# Patient Record
Sex: Female | Born: 1975 | Hispanic: No | Smoking: Never smoker
Health system: Southern US, Community
[De-identification: ages and names within clinical notes are randomized; demographics above are authoritative.]

## PROBLEM LIST (undated history)

## (undated) DIAGNOSIS — Z789 Other specified health status: Secondary | ICD-10-CM

## (undated) DIAGNOSIS — N289 Disorder of kidney and ureter, unspecified: Secondary | ICD-10-CM

## (undated) DIAGNOSIS — N210 Calculus in bladder: Secondary | ICD-10-CM

## (undated) DIAGNOSIS — R7303 Prediabetes: Secondary | ICD-10-CM

## (undated) DIAGNOSIS — E785 Hyperlipidemia, unspecified: Secondary | ICD-10-CM

## (undated) DIAGNOSIS — K219 Gastro-esophageal reflux disease without esophagitis: Secondary | ICD-10-CM

## (undated) HISTORY — PX: BREAST SURGERY: SHX581

## (undated) HISTORY — DX: Disorder of kidney and ureter, unspecified: N28.9

## (undated) HISTORY — PX: AUGMENTATION MAMMAPLASTY: SUR837

## (undated) HISTORY — PX: ABDOMINOPLASTY: SUR9

## (undated) HISTORY — DX: Calculus in bladder: N21.0

---

## 2004-08-30 HISTORY — PX: COSMETIC SURGERY: SHX468

## 2006-01-06 ENCOUNTER — Emergency Department: Payer: Self-pay | Admitting: Emergency Medicine

## 2010-10-12 ENCOUNTER — Emergency Department: Payer: Self-pay | Admitting: Emergency Medicine

## 2010-11-25 ENCOUNTER — Ambulatory Visit: Payer: Self-pay | Admitting: Obstetrics and Gynecology

## 2010-11-27 ENCOUNTER — Ambulatory Visit: Payer: Self-pay | Admitting: Obstetrics and Gynecology

## 2016-12-09 ENCOUNTER — Other Ambulatory Visit: Payer: Self-pay | Admitting: Physician Assistant

## 2016-12-09 DIAGNOSIS — Z3689 Encounter for other specified antenatal screening: Secondary | ICD-10-CM

## 2016-12-09 DIAGNOSIS — O30009 Twin pregnancy, unspecified number of placenta and unspecified number of amniotic sacs, unspecified trimester: Principal | ICD-10-CM

## 2016-12-27 ENCOUNTER — Ambulatory Visit
Admission: RE | Admit: 2016-12-27 | Discharge: 2016-12-27 | Disposition: A | Discharge status: Home / Self Care | Payer: Medicaid Other | Source: Ambulatory Visit | Attending: Maternal & Fetal Medicine | Admitting: Maternal & Fetal Medicine

## 2016-12-27 VITALS — BP 125/67 | HR 88 | Temp 98.4°F | Resp 18 | Ht 62.0 in | Wt 155.0 lb

## 2016-12-27 DIAGNOSIS — Z3A18 18 weeks gestation of pregnancy: Secondary | ICD-10-CM | POA: Diagnosis not present

## 2016-12-27 DIAGNOSIS — Z3689 Encounter for other specified antenatal screening: Secondary | ICD-10-CM | POA: Diagnosis present

## 2016-12-27 DIAGNOSIS — O30009 Twin pregnancy, unspecified number of placenta and unspecified number of amniotic sacs, unspecified trimester: Secondary | ICD-10-CM

## 2016-12-27 DIAGNOSIS — O09522 Supervision of elderly multigravida, second trimester: Secondary | ICD-10-CM | POA: Diagnosis not present

## 2016-12-27 HISTORY — DX: Other specified health status: Z78.9

## 2016-12-27 NOTE — Progress Notes (Addendum)
Referring Provider:   [facility] Department Length of Consultation: 45 minutes  Megan Knight was referred to [facility] [place] for genetic counseling because of advanced maternal age.  The patient will be 41 years old at the time of delivery.  This note summarizes the information we discussed.    We explained that the chance of a chromosome abnormality increases with maternal age.  Chromosomes and examples of chromosome problems were reviewed.  Humans typically have 46 chromosomes in each cell, with half passed through each sperm and egg.  Any change in the number or structure of chromosomes can increase the risk of problems in the physical and mental development of a pregnancy.   Based upon age of the patient, the chance of any chromosome abnormality was 1 in 79. The chance of Down syndrome, the most common chromosome problem associated with maternal age, was 1 in 22.  The risk of chromosome problems is in addition to the 3% general population risk for birth defects and mental retardation.  The greatest chance, of course, is that the baby would be born in good health.  We discussed the following prenatal screening and testing options for this pregnancy:  Maternal serum marker screening, a blood test that measures pregnancy proteins, can provide risk assessments for Down syndrome, trisomy 18, and open neural tube defects (spina bifida, anencephaly). Because it does not directly examine the fetus, it cannot positively diagnose or rule out these problems.  Targeted ultrasound uses high frequency sound waves to create an image of the developing fetus.  An ultrasound is often recommended as a routine means of evaluating the pregnancy.  It is also used to screen for fetal anatomy problems (for example, a heart defect) that might be suggestive of a chromosomal or other abnormality.   Amniocentesis involves the removal of a small amount of amniotic fluid from the sac  surrounding the fetus with the use of a thin needle inserted through the maternal abdomen and uterus.  Ultrasound guidance is used throughout the procedure.  Fetal cells from amniotic fluid are directly evaluated and > 99.5% of chromosome problems and > 98% of open neural tube defects can be detected. This procedure is generally performed after the 15th week of pregnancy.  The main risks to this procedure include complications leading to miscarriage in less than 1 in 200 cases (0.5%).  We also reviewed the availability of cell free fetal DNA testing from maternal blood to determine whether or not the baby may have either Down syndrome, trisomy 60, or trisomy 41.  This test utilizes a maternal blood sample and DNA sequencing technology to isolate circulating cell free fetal DNA from maternal plasma.  The fetal DNA can then be analyzed for DNA sequences that are derived from the three most common chromosomes involved in aneuploidy, chromosomes 13, 18, and 21.  If the overall amount of DNA is greater than the expected level for any of these chromosomes, aneuploidy is suspected.  While we do not consider it a replacement for invasive testing and karyotype analysis, a negative result from this testing would be reassuring, though not a guarantee of a normal chromosome complement for the baby.  An abnormal result is certainly suggestive of an abnormal chromosome complement, though we would still recommend amniocentesis to confirm any findings from this testing.  Cystic Fibrosis and Spinal Muscular Atrophy (SMA) screening were also discussed with the patient. Both conditions are recessive, which means that both parents must be carriers in order  to have a child with the disease.  Cystic fibrosis (CF) is one of the most common genetic conditions in persons of Caucasian ancestry.  This condition occurs in approximately 1 in 2,500 Caucasian persons and results in thickened secretions in the lungs, digestive, and  reproductive systems.  For a baby to be at risk for having CF, both of the parents must be carriers for this condition.  Approximately 1 in 41 Caucasian persons is a carrier for CF.  Current carrier testing looks for the most common mutations in the gene for CF and can detect approximately 90% of carriers in the Caucasian population.  This means that the carrier screening can greatly reduce, but cannot eliminate, the chance for an individual to have a child with CF.  If an individual is found to be a carrier for CF, then carrier testing would be available for the partner. As part of Megan Knight newborn screening profile, all babies born in the state of [place] will have a two-tier screening process.  Specimens are first tested to determine the concentration of immunoreactive trypsinogen (IRT).  The top 5% of specimens with the highest IRT values then undergo DNA testing using a panel of over 40 common CF mutations. SMA is a neurodegenerative disorder that leads to atrophy of skeletal muscle and overall weakness.  This condition is also more prevalent in the Caucasian population, with 1 in 40-1 in 60 persons being a carrier and 1 in 6,000-1 in 10,000 children being affected.  There are multiple forms of the disease, with some causing death in infancy to other forms with survival into adulthood.  The genetics of SMA is complex, but carrier screening can detect up to 95% of carriers in the Caucasian population.  Similar to CF, a negative result can greatly reduce, but cannot eliminate, the chance to have a child with SMA.  We obtained a detailed family history and pregnancy history.  The remainder of the family history is unremarkable for birth defects, developmental delays, recurrent pregnancy loss or known chromosome abnormalities.  Megan Knight stated that this is her fourth pregnancy, the second with her current partner.  She has two health children, ages 32 and 82, from a prior relationship.  She  reported no complications or exposures in this pregnancy that would be expected to increase the risk for birth defects.  After consideration of the options, Megan Knight elected to proceed with an ultrasound, cell free fetal DNA testing and msAFP screening.  She declined carrier testing for CF and SMA and declined amniocentesis at this time.  An ultrasound was performed at the time of the visit.  The gestational age was consistent with 18 weeks.   Parts of the fetal anatomy were suboptimally seen including the heart, spine and nasal bone.  The right renal pelvis measured 4.61mm, above the cutoff of 4.76mm. This finding was reviewed with the patient along with the recommendation for follow up imaging at 28 weeks.  Please refer to the ultrasound report for details of the study.  Given the suboptimal results of today's ultrasound and the maternal age greater than 40 years, we will schedule for her to be seen at the [facility] in approximately 2 weeks for completion of anatomy and a screening fetal echocardiogram.  Megan Knight was encouraged to call with questions or concerns.  We can be contacted at [phone].   Cherly Anderson, MS, CGC  I was immediately available and supervising. Argentina Ponder, MD [facility]

## 2016-12-30 LAB — AFP, SERUM, OPEN SPINA BIFIDA
AFP MoM: 1.68
AFP Value: 70.5 ng/mL
Gest. Age on Collection Date: 18 weeks
Maternal Age At EDD: 41.2 yr
OSBR Risk 1 IN: 1713
Test Results:: NEGATIVE
WEIGHT: 155 [lb_av]

## 2016-12-31 LAB — INFORMASEQ(SM) WITH XY ANALYSIS
FETAL NUMBER: 1
Fetal Fraction (%):: 11.4
Gestational Age at Collection: 18 weeks
Weight: 155 [lb_av]

## 2017-01-03 ENCOUNTER — Telehealth: Payer: Self-pay | Admitting: Obstetrics and Gynecology

## 2017-01-03 ENCOUNTER — Other Ambulatory Visit: Payer: Self-pay | Admitting: Obstetrics and Gynecology

## 2017-01-03 NOTE — Telephone Encounter (Signed)
The patient was informed of the results of her recent InformaSeq testing (performed at [facility]) which yielded NEGATIVE results.  The patient's specimen showed DNA consistent with two copies of chromosomes 21, 18 and 13.  The sensitivity for trisomy 21, trisomy 18 and trisomy 13 using this testing are reported as 99.1%, 98.3% and 98.1% respectively.  Thus, while the results of this testing are highly accurate, they are not considered diagnostic at this time.  Should more definitive information be desired, the patient may still consider amniocentesis.   As requested to know by the patient, sex chromosome analysis was included for this sample.  Results was consistent with a female (XY)  fetus. This is predicted with >97% accuracy.  A maternal serum AFP was also drawn at the time of her visit.  Results are within normal limits. The chance for a open neural tube defect is estimated to be 1 in 1,713.  The patient was informed of these results and knows that she needs to be scheduled for a follow up ultrasound at [facility] in the next couple of weeks.  Ronisha Herringshaw F. Frantz Quattrone, MS, CGC  

## 2017-01-03 NOTE — Telephone Encounter (Signed)
The patient was informed of the results of her recent InformaSeq testing (performed at [facility]) which yielded NEGATIVE results.  The patient's specimen showed DNA consistent with two copies of chromosomes 21, 18 and 13.  The sensitivity for trisomy 5721, trisomy 6318 and trisomy 5413 using this testing are reported as 99.1%, 98.3% and 98.1% respectively.  Thus, while the results of this testing are highly accurate, they are not considered diagnostic at this time.  Should more definitive information be desired, the patient may still consider amniocentesis.   As requested to know by the patient, sex chromosome analysis was included for this sample.  Results was consistent with a female (XY)  fetus. This is predicted with >97% accuracy.  A maternal serum AFP was also drawn at the time of her visit.  Results are within normal limits. The chance for a open neural tube defect is estimated to be 1 in 1,713.  The patient was informed of these results and knows that she needs to be [facility] in the next couple of weeks.  Cherly Andersoneborah F. Axelle Szwed, MS, CGC

## 2017-03-30 ENCOUNTER — Other Ambulatory Visit: Payer: Self-pay | Admitting: Nurse Practitioner

## 2017-03-30 DIAGNOSIS — Z3689 Encounter for other specified antenatal screening: Secondary | ICD-10-CM

## 2017-04-07 ENCOUNTER — Ambulatory Visit
Admission: RE | Admit: 2017-04-07 | Discharge: 2017-04-07 | Disposition: A | Discharge status: Home / Self Care | Payer: Medicaid Other | Source: Ambulatory Visit | Attending: Obstetrics & Gynecology | Admitting: Obstetrics & Gynecology

## 2017-04-07 DIAGNOSIS — Z3A32 32 weeks gestation of pregnancy: Secondary | ICD-10-CM | POA: Insufficient documentation

## 2017-04-07 DIAGNOSIS — Z3689 Encounter for other specified antenatal screening: Secondary | ICD-10-CM | POA: Insufficient documentation

## 2017-07-30 ENCOUNTER — Emergency Department: Payer: Medicaid Other

## 2017-07-30 ENCOUNTER — Other Ambulatory Visit: Payer: Self-pay

## 2017-07-30 ENCOUNTER — Emergency Department
Admission: EM | Admit: 2017-07-30 | Discharge: 2017-07-30 | Disposition: A | Discharge status: Home / Self Care | Payer: Medicaid Other | Attending: Emergency Medicine | Admitting: Emergency Medicine

## 2017-07-30 ENCOUNTER — Encounter: Payer: Self-pay | Admitting: Emergency Medicine

## 2017-07-30 DIAGNOSIS — R1011 Right upper quadrant pain: Secondary | ICD-10-CM | POA: Insufficient documentation

## 2017-07-30 DIAGNOSIS — K8050 Calculus of bile duct without cholangitis or cholecystitis without obstruction: Secondary | ICD-10-CM

## 2017-07-30 LAB — TROPONIN I: Troponin I: <0.03 ng/mL (ref <0.03)

## 2017-07-30 LAB — CBC
HCT: 44.1 % (ref 35.0–47.0)
Hemoglobin: 14.8 g/dL (ref 12.0–16.0)
MCH: 30.2 pg (ref 26.0–34.0)
MCHC: 33.5 g/dL (ref 32.0–36.0)
MCV: 90.1 fL (ref 80.0–100.0)
PLATELETS: 292 10*3/uL (ref 150–440)
RBC: 4.9 MIL/uL (ref 3.80–5.20)
RDW: 13.8 % (ref 11.5–14.5)
WBC: 11.9 10*3/uL — ABNORMAL HIGH (ref 3.6–11.0)

## 2017-07-30 LAB — URINALYSIS, COMPLETE (UACMP) WITH MICROSCOPIC
Bacteria, UA: NONE SEEN
Bilirubin Urine: NEGATIVE
GLUCOSE, UA: NEGATIVE mg/dL
Ketones, ur: NEGATIVE mg/dL
Leukocytes, UA: NEGATIVE
Nitrite: NEGATIVE
PROTEIN: NEGATIVE mg/dL
SPECIFIC GRAVITY, URINE: 1.014 (ref 1.005–1.030)
pH: 6 (ref 5.0–8.0)

## 2017-07-30 LAB — BASIC METABOLIC PANEL
Anion gap: 6 (ref 5–15)
BUN: 16 mg/dL (ref 6–20)
CO2: 27 mmol/L (ref 22–32)
Calcium: 9.7 mg/dL (ref 8.9–10.3)
Chloride: 105 mmol/L (ref 101–111)
Creatinine, Ser: 0.8 mg/dL (ref 0.44–1.00)
GFR calc non Af Amer: >60 mL/min (ref >60)
Glucose, Bld: 122 mg/dL — ABNORMAL HIGH (ref 65–99)
Potassium: 3.9 mmol/L (ref 3.5–5.1)
SODIUM: 138 mmol/L (ref 135–145)

## 2017-07-30 LAB — POCT PREGNANCY, URINE: PREG TEST UR: NEGATIVE

## 2017-07-30 LAB — LIPASE, BLOOD: LIPASE: 40 U/L (ref 11–51)

## 2017-07-30 MED ORDER — ONDANSETRON HCL 4 MG PO TABS: 4.0000 mg | ORAL_TABLET | Freq: Every day | ORAL | 0 refills | Status: DC | PRN

## 2017-07-30 MED ORDER — GI COCKTAIL ~~LOC~~
30.0000 mL | Freq: Once | ORAL | Status: AC
  Administered 2017-07-30: 30 mL via ORAL

## 2017-07-30 MED ORDER — GI COCKTAIL ~~LOC~~
ORAL | Status: AC
  Filled 2017-07-30: qty 30

## 2017-07-30 MED ORDER — IBUPROFEN 600 MG PO TABS: 600.0000 mg | ORAL_TABLET | Freq: Three times a day (TID) | ORAL | 0 refills | Status: DC | PRN

## 2017-07-30 MED ORDER — HYDRALAZINE HCL 20 MG/ML IJ SOLN: 5.0000 mg | Freq: Once | INTRAMUSCULAR | Status: DC

## 2017-07-30 NOTE — ED Triage Notes (Signed)
Pt reports that she has been having upper abdominal pain since last night. Pt reports hx of GERD. Pt is in NAD.

## 2017-07-30 NOTE — Discharge Instructions (Signed)
Please take your pain medication as needed for severe symptoms and follow-up with the general surgeon in a week or so for reevaluation.  Return to the emergency department sooner for any concerns whatsoever.  It was a pleasure to take care of you today, and thank you for coming to our emergency department.  If you have any questions or concerns before leaving please ask the nurse to grab me and I'm more than happy to go through your aftercare instructions again.  If you were prescribed any opioid pain medication today such as Norco, Vicodin, Percocet, morphine, hydrocodone, or oxycodone please make sure you do not drive when you are taking this medication as it can alter your ability to drive safely.  If you have any concerns once you are home that you are not improving or are in fact getting worse before you can make it to your follow-up appointment, please do not hesitate to call 911 and come back for further evaluation.  Merrily BrittleNeil Santiel Topper, MD  Results for orders placed or performed during the hospital encounter of 07/30/17  Basic metabolic panel  Result Value Ref Range   Sodium 138 135 - 145 mmol/L   Potassium 3.9 3.5 - 5.1 mmol/L   Chloride 105 101 - 111 mmol/L   CO2 27 22 - 32 mmol/L   Glucose, Bld 122 (H) 65 - 99 mg/dL   BUN 16 6 - 20 mg/dL   Creatinine, Ser 0.980.80 0.44 - 1.00 mg/dL   Calcium 9.7 8.9 - 11.910.3 mg/dL   GFR calc non Af Amer >60 >60 mL/min   GFR calc Af Amer >60 >60 mL/min   Anion gap 6 5 - 15  CBC  Result Value Ref Range   WBC 11.9 (H) 3.6 - 11.0 K/uL   RBC 4.90 3.80 - 5.20 MIL/uL   Hemoglobin 14.8 12.0 - 16.0 g/dL   HCT 14.744.1 82.935.0 - 56.247.0 %   MCV 90.1 80.0 - 100.0 fL   MCH 30.2 26.0 - 34.0 pg   MCHC 33.5 32.0 - 36.0 g/dL   RDW 13.013.8 86.511.5 - 78.414.5 %   Platelets 292 150 - 440 K/uL  Troponin I  Result Value Ref Range   Troponin I <0.03 <0.03 ng/mL  Lipase, blood  Result Value Ref Range   Lipase 40 11 - 51 U/L  Urinalysis, Complete w Microscopic  Result Value Ref Range     Color, Urine YELLOW (A) YELLOW   APPearance CLEAR (A) CLEAR   Specific Gravity, Urine 1.014 1.005 - 1.030   pH 6.0 5.0 - 8.0   Glucose, UA NEGATIVE NEGATIVE mg/dL   Hgb urine dipstick SMALL (A) NEGATIVE   Bilirubin Urine NEGATIVE NEGATIVE   Ketones, ur NEGATIVE NEGATIVE mg/dL   Protein, ur NEGATIVE NEGATIVE mg/dL   Nitrite NEGATIVE NEGATIVE   Leukocytes, UA NEGATIVE NEGATIVE   RBC / HPF 0-5 0 - 5 RBC/hpf   WBC, UA 0-5 0 - 5 WBC/hpf   Bacteria, UA NONE SEEN NONE SEEN   Squamous Epithelial / LPF 0-5 (A) NONE SEEN  Pregnancy, urine POC  Result Value Ref Range   Preg Test, Ur NEGATIVE NEGATIVE   Dg Chest 2 View  Result Date: 07/30/2017 CLINICAL DATA:  Upper abdominal pain.  Chest pain. EXAM: CHEST  2 VIEW COMPARISON:  None. FINDINGS: The cardiomediastinal contours are normal. The lungs are clear. Pulmonary vasculature is normal. No consolidation, pleural effusion, or pneumothorax. No acute osseous abnormalities are seen. IMPRESSION: No acute pulmonary process. Electronically Signed  By: Rubye OaksMelanie  Ehinger M.D.   On: 07/30/2017 05:17   [place] Abdomen Limited Ruq  Result Date: 07/30/2017 CLINICAL DATA:  Right upper quadrant pain EXAM: ULTRASOUND ABDOMEN LIMITED RIGHT UPPER QUADRANT COMPARISON:  None. FINDINGS: Gallbladder: Few small mobile shadowing stones, the largest 1.5 cm. No wall thickening or sonographic Murphy sign. Common bile duct: Diameter: Normal caliber, 3 mm. Liver: Slight increased echotexture throughout the liver suggesting fatty infiltration. No focal abnormality. Portal vein is patent on color Doppler imaging with normal direction of blood flow towards the liver. IMPRESSION: Cholelithiasis.  No sonographic evidence of acute cholecystitis. Fatty infiltration of the liver. Electronically Signed   By: Charlett NoseKevin  Dover M.D.   On: 07/30/2017 07:15

## 2017-07-30 NOTE — ED Provider Notes (Signed)
[facility] Emergency Department Provider Note    First MD Initiated Contact with Patient 07/30/17 [phone]     (approximate)  I have reviewed the triage vital signs and the nursing notes.   HISTORY  Chief Complaint Chest Pain    HPI Megan Knight is a 41 y.o. female 2 months postpartum presents to the emergency department with right upper quadrant/epigastric abdominal pain which began last night.  Patient states current pain score is 8 out of 10.  Patient denies any fever.  Patient is admit to nausea and vomiting.   Past Medical History:  Diagnosis Date  . Medical history non-contributory     Patient Active Problem List   Diagnosis Date Noted  . Advanced maternal age in multigravida, second trimester     Past Surgical History:  Procedure Laterality Date  . CESAREAN SECTION      Prior to Admission medications   Not on File    Allergies No known drug allergies  No family history on file.  Social History Social History   Tobacco Use  . Smoking status: Never Smoker  . Smokeless tobacco: Never Used  Substance Use Topics  . Alcohol use: No  . Drug use: No    Review of Systems Constitutional: No fever/chills Eyes: No visual changes. ENT: No sore throat. Cardiovascular: Denies chest pain. Respiratory: Denies shortness of breath. Gastrointestinal: Positive for abdominal pain, nausea, vomiting.  No diarrhea.  No constipation. Genitourinary: Negative for dysuria. Musculoskeletal: Negative for neck pain.  Negative for back pain. Integumentary: Negative for rash. Neurological: Negative for headaches, focal weakness or numbness.   ____________________________________________   PHYSICAL EXAM:  VITAL SIGNS: ED Triage Vitals  Enc Vitals Group     BP 07/30/17 0439 130/70     Pulse Rate 07/30/17 0439 78     Resp 07/30/17 0439 18     Temp 07/30/17 0439 97.9 F (36.6 C)     Temp Source 07/30/17 0439 Oral     SpO2 07/30/17 0439 100 %      Weight 07/30/17 0436 67.1 kg (148 lb)     Height 07/30/17 0436 1.6 m (5\' 3" )     Head Circumference --      Peak Flow --      Pain Score 07/30/17 0432 10     Pain Loc --      Pain Edu? --      Excl. in GC? --     Constitutional: Alert and oriented. Well appearing and in no acute distress. Eyes: Conjunctivae are normal.  Head: Atraumatic. Mouth/Throat: Mucous membranes are moist.  Oropharynx non-erythematous. Neck: No stridor.  Cardiovascular: Normal rate, regular rhythm. Good peripheral circulation. Grossly normal heart sounds. Respiratory: Normal respiratory effort.  No retractions. Lungs CTAB. Gastrointestinal: Right upper quadrant tenderness to palpation. No distention.  Musculoskeletal: No lower extremity tenderness nor edema. No gross deformities of extremities. Neurologic:  Normal speech and language. No gross focal neurologic deficits are appreciated.  Skin:  Skin is warm, dry and intact. No rash noted. Psychiatric: Mood and affect are normal. Speech and behavior are normal.  ____________________________________________   LABS (all labs ordered are listed, but only abnormal results are displayed)  Labs Reviewed  BASIC METABOLIC PANEL - Abnormal; Notable for the following components:      Result Value   Glucose, Bld 122 (*)    All other components within normal limits  CBC - Abnormal; Notable for the following components:   WBC 11.9 (*)  All other components within normal limits  URINALYSIS, COMPLETE (UACMP) WITH MICROSCOPIC - Abnormal; Notable for the following components:   Color, Urine YELLOW (*)    APPearance CLEAR (*)    Hgb urine dipstick SMALL (*)    Squamous Epithelial / LPF 0-5 (*)    All other components within normal limits  TROPONIN I  LIPASE, BLOOD  POCT PREGNANCY, URINE  POC URINE PREG, ED   ____________________________________________  EKG  ED ECG REPORT I, Kingston N BROWN, the attending physician, personally viewed and interpreted this  ECG.   Date: 07/30/2017  EKG Time: 4:35 AM  Rate: 87  Rhythm: Normal sinus rhythm  Axis: Normal  Intervals: Normal  ST&T Change: None  ____________________________________________  RADIOLOGY I, Cheney N BROWN, personally viewed and evaluated these images (plain radiographs) as part of my medical decision making, as well as reviewing the written report by the radiologist.  Dg Chest 2 View  Result Date: 07/30/2017 CLINICAL DATA:  Upper abdominal pain.  Chest pain. EXAM: CHEST  2 VIEW COMPARISON:  None. FINDINGS: The cardiomediastinal contours are normal. The lungs are clear. Pulmonary vasculature is normal. No consolidation, pleural effusion, or pneumothorax. No acute osseous abnormalities are seen. IMPRESSION: No acute pulmonary process. Electronically Signed   By: Rubye OaksMelanie  Ehinger M.D.   On: 07/30/2017 05:17     Procedures   ____________________________________________   INITIAL IMPRESSION / ASSESSMENT AND PLAN / ED COURSE  As part of my medical decision making, I reviewed the following data within the electronic MEDICAL RECORD [id number] year old female present with history and physical exam concerning for possible cholelithiasis.    FINAL CLINICAL IMPRESSION(S) / ED DIAGNOSES  Final diagnoses:  RUQ pain     MEDICATIONS GIVEN DURING THIS VISIT:  Medications  gi cocktail suspension (not administered)  gi cocktail (Maalox,Lidocaine,Donnatal) (30 mLs Oral Given 07/30/17 [id number])     ED Discharge Orders    None       Note:  This document was prepared using Dragon voice recognition software and may include unintentional dictation errors.    Darci CurrentBrown, Irene N, MD 07/30/17 [phone]

## 2017-07-30 NOTE — ED Provider Notes (Signed)
Care signed over from Dr. Manson PasseyBrown pending right upper quadrant ultrasound.  Right upper quadrant ultrasound shows 1.5 cm stone with no evidence of cholecystitis.  The patient is able to eat and drink.  I will treat her symptomatically with nonsteroidals and Zofran and refer her to general surgery.  Patient verbalized understanding and agreement the plan.   Merrily Brittleifenbark, Anshika Pethtel, MD 07/30/17 [phone]

## 2017-07-30 NOTE — ED Notes (Signed)
ED Provider at bedside. 

## 2017-07-30 NOTE — ED Notes (Signed)
Pt in US

## 2017-10-13 ENCOUNTER — Ambulatory Visit (INDEPENDENT_AMBULATORY_CARE_PROVIDER_SITE_OTHER): Payer: Medicaid Other | Admitting: Surgery

## 2017-10-13 ENCOUNTER — Encounter: Payer: Self-pay | Admitting: Surgery

## 2017-10-13 VITALS — BP 136/89 | HR 98 | Temp 98.1°F | Ht 63.0 in | Wt 149.4 lb

## 2017-10-13 DIAGNOSIS — R1011 Right upper quadrant pain: Secondary | ICD-10-CM

## 2017-10-13 MED ORDER — OMEPRAZOLE 20 MG PO CPDR: 20.0000 mg | DELAYED_RELEASE_CAPSULE | Freq: Every day | ORAL | 0 refills | Status: DC

## 2017-10-13 NOTE — Patient Instructions (Addendum)
Hy b?t ??u dng Prilosec m?t vin m?i ngy. Hy ghi l?i nh?ng lo?i th?c ph?m b?n ? ?n khi?n b?n b? ?au b?ng. Chng ti s? g?p l?i b?n sau m?t thng n?a ?? ch?c ch?n r?ng b?n ? c?m th?y nh? th? no.   B?nh s?i m?t Cholelithiasis B?nh s?i m?t l m?t d?ng b?nh ? ti m?t trong ? c s?i hnh thnh trong ti m?t. Ti m?t l m?t c? quan l?u tr? m?t. M?t ???c s?n sinh trong gan v n gip tiu ha cc ch?t bo. S?i m?t b?t ??u d??i d?ng tinh th? nh? v d?n d?n pht tri?n thnh s?i. S?i khng gy ra tri?u ch?ng g cho ??n khi ti m?t th?t ch?t l?i (co l?i) v s?i m?t lm t?c ?ng d?n (c?n ?au qu?n m?t), tnh tr?ng ny c th? gy ?au. B?nh s?i m?t cn ???c g?i l s?i m?t. C hai lo?i s?i m?t chnh:  S?i sholesterol. S?i c c?u t?o t? cholesterol c?ng l?i v th??ng c mu vng-xanh. ?y l lo?i s?i m?t ph? bi?n nh?t. Cholesterol l m?t ch?t gi?ng m? c mu tr?ng, d?ng sp ???c s?n sinh trong gan.  S?i s?c t? m?t. ?y l lo?i s?i s?m mu v ???c t?o thnh t? m?t ch?t c mu ??-vng hnh thnh khi hemoglobin ? h?ng c?u v? ra (bilirubin).  Nguyn nhn g gy ra? Nguyn nhn c?a tnh tr?ng ny c th? l do s? m?t cn b?ng trong cc ch?t c?u t?o c?a m?t. Tnh tr?ng ny c th? x?y ra n?u m?t:  C qu nhi?u bilirubin.  C qu nhi?u cholesterol.  Khng c ?? mu?i m?t. Cc lo?i mu?i ny gip cho c? th? h?p thu v tiu ha cc ch?t bo.  Trong m?t s? tr??ng h?p, nguyn nhn c?a tnh tr?ng ny c?ng c th? l do ti m?t khng x? h?t d?ch m?t ho?c x? d?ch m?t khng th??ng xuyn. ?i?u g lm t?ng nguy c?? Nh?ng y?u t? sau c th? lm qu v? d? b? tnh tr?ng ny h?n:  L ph? n?.  Mang ?a [place]. Chuyn gia ch?m [place] s?c kh?e ?i khi t? v?n lo?i b? s?i th?n tr??c khi mang thai nh?ng l?n ti?p theo.  ?n ch? ?? ?n nhi?u th?c ?n chin/rn, giu ch?t bo v cc lo?i hy?at-cacbon tinh luy?n, nh? bnh m tr?ng v g?o tr?ng.  B? bo ph.  C ?? tu?i trn 40.  S? d?ng lu di c?a cc lo?i thu?c c ch?a hc mn n?  (estrogen).  B? b?nh ti?u ???ng.  Gi?m cn nhanh.  C ti?n s? gia ?nh b? s?i m?t.  L ng??i da ?? M? ho?c ng??i c ngu?n g?c [place].  B? b?nh ???ng ru?t, ch?ng h?n nh? b?nh [place].  B? h?i ch?ng chuy?n ha.  B? b?nh x? gan.  B? cc lo?i thi?u mu n?ng nh? thi?u mu h?ng c?u hnh li?m.  Cc d?u hi?u ho?c tri?u ch?ng l g? Trong h?u h?t cc tr??ng h?p, khng c tri?u ch?ng g. Nh?ng tr??ng h?p ny ???c g?i l s?i m?t th?m l?ng. N?u s?i m?t lm t?c ?ng d?n m?t, n c th? gy ra hi?n t??ng c?n ?au qu?n m?t. Tri?u ch?ng chnh c?a c?n ?au qu?n m?t l ?au ??t ng?t ? b?ng trn bn ph?i. C?n ?au th??ng ??n vo ban ?m ho?c sau khi ?n no. C?n ?au c th? ko di m?t ho?c vi gi? v c th? lan sang ng?c ho?c vai ph?i. N?u ?ng m?t b? t?c trn  vi gi?, n c th? gy nhi?m trng ho?c vim ti m?t, vim gan, ho?c vim t?y, t? ? c th? d?n ??n:  Bu?n nn.  Nn.  ?au b?ng ko di t? 5 gi? tr? ln.  S?t ho?c ?n l?nh.  Gy vng da ho?c vng ph?n lng tr?ng m?t (ch?ng vng da).  N??c ti?u ??m mu.  Phn b?c mu.  Ch?n ?on tnh tr?ng ny nh? th? no? Tnh tr?ng ny c th? ???c ch?n ?on d?a vo:  Khm th?c th?.  B?nh s? c?a qu v?.  Siu m ti m?t.  Ch?p CT.  Ch?p MRI.  Xt nghi?m mu ?? ki?m tra d?u hi?u nhi?m trng ho?c vim.  Ch?p ti m?t v ?ng d?n m?t (h? th?ng ???ng m?t) b?ng cch s? d?ng ch?t phng x? v h?i v camera ??c bi?t ?? c th? nhn th?y ch?t phng x? ? (cholescintigram). Ki?m tra ny s? xem ti m?t c?a qu v? co bp nh? th? no v ?ng d?n m?t c b? t?c hay khng.  Lu?n m?t ?ng nh? g?n camera ? ??u (my n?i soi) qua mi?ng ?? ki?m tra ?ng d?n m?t v xem c t?c ngh?n hay khng (n?i soi m?t - t?y ng??c dng).  Tnh tr?ng ny ???c ?i?u tr? nh? th? no? Vi?c ?i?u tr? s?i m?t ty thu?c vo m??c ?? n?ng c?a b?nh. S?i m?t th?m l?ng khng c?n ?i?u tr?. C th? c?n ph?i ?i?u tr? n?u s?i m?t gy c?n ?au qu?n m?t ho?c cc tri?u ch?ng khc. Cc ph??ng n ?i?u tr? bao  g?m:  Ph?u thu?t ?? lo?i b? ti m?t (th? thu?t c?t ti m?t). ?y l ph??ng n ?i?u tr? ph? bi?n nh?t.  Thu?c ?? lm tan s?i m?t. ?y l cch ?i?u tr? hi?u qu? nh?t ??i v?i s?i m?t nh?Ladell Heads v? c th? c?n dng thu?c t?i ?a t? 6-12 thng.  ?i?u tr? b?ng sng xung kch (th? thu?t tn s?i m?t ngoi c? th?). Trong ?i?u tr? ny, m?t my siu m truy?n sng xung kch ??n ti m?t ?? lm v? s?i m?t thnh nh?ng m?nh nh? h?n. Nh?ng m?nh nh? ny c th? tri vo ru?t ho?c b? thu?c lm tan. Cch ny hi?m khi ????c s? d?ng.  Lo?i b? s?i m?t b?ng n?i soi m?t - t?y ng??c dng. M?t ci gi? nh? c th? ???c g?n vo my n?i soi v ???c s? d?ng ?? b?t gi? v l?y b? s?i m?t.  Tun th? nh?ng h??ng d?n ny ? nh:  Ch? s? d?ng thu?c khng k ??n v thu?c k ??n theo ch? d?n c?a chuyn gia ch?m [place] s?c kh?e.  Duy tr cn n?ng c l?i cho s?c kh?e v tun th? ch? ?? ?n lnh m?nh. Vi?c ny bao g?m: ? Gi?m cc th?c ph?m c nhi?u m?, ch?ng h?n nh? th?c ph?m chin/rn. ? Gi?m cc hy?at-cacbon tinh, nh? bnh m tr?ng v g?o tr?ng. ? T?ng ch?t x?. Nh?m ??n cc th?c ph?m nh? h?nh nhn, tri cy, v ??u.  Tun th? t?t c? cc l?n khm theo di theo ch? d?n c?a chuyn gia ch?m [place] s?c kh?e. ?i?u ny c vai tr quan tr?ng. Hy lin l?c v?i chuyn gia ch?m [place] s?c kh?e n?u:  Qu v? ngh? r?ng qu v? b? c?n ?au qu?n m?t.  Qu v? ???c ch?n ?on b? s?i m?t th?m l?ng v qu v? b? ?au b?ng ho?c kh tiu. Yu c?u tr? gip ngay l?p t?c n?u:  Qu v? ?au do b?  c?n ?au qu?n m?t ko di trn 2 gi?Ladell Heads v? ?au b?ng ko di trn 5 gi?Ladell Heads v? b? s?t ho?c ?n l?nh.  Qu v? b? bu?n nn v nn m?a lin t?c.  Qu v? b? vng da.  Qu v? c n??c ti?u mu s?m ho?c phn b?c mu. Tm t?t  B?nh s?i m?t (cn ???c g?i l s?i m?t) l m?t d?ng b?nh ? ti m?t, trong ? s?i hnh thnh trong ti m?t.  Nguyn nhn c?a tnh tr?ng ny l do s? m?t cn b?ng trong cc ch?t t?o m?t. Tnh tr?ng ny c th? x?y ra n?u m?t c qu nhi?u cholesterol, qu  nhi?u bilirubin, ho?c khng ?? mu?i m?t.  Qu v? d? b? tnh tr?ng ny h?n n?u qu v? l ph? n?, ?ang mang [place], s? d?ng thu?c c estrogen, bo ph, trn 40 tu?i, ho?c c ti?n s? gia ?nh b? s?i m?t. Qu v? c?ng c th? b? s?i m?t n?u qu v? b? ti?u ???ng, b?nh ???ng ru?t, b?nh x? gan, ho?c h?i ch?ng chuy?n ha.  Vi?c ?i?u tr? s?i m?t ty thu?c vo m??c ?? n?ng c?a b?nh. S?i m?t th?m l?ng khng c?n ?i?u tr?.  C th? c?n ph?i ?i?u tr? n?u s?i m?t gy ra c?n ?au qu?n m?t ho?c cc tri?u ch?ng khc. Ph??ng php ?i?u tr? ph? bi?n nh?t l ph?u thu?t ?? lo?i b? ti m?t. Thng tin ny khng nh?m m?c ?ch thay th? cho l?i khuyn m chuyn gia ch?m [place] s?c kh?e ni v?i qu v?. Hy b?o ??m qu v? ph?i th?o lu?n b?t k? v?n ?? g m qu v? c v?i chuyn gia ch?m [place] s?c kh?e c?a qu v?. Document Released: 09/12/2015 Document Revised: 11/29/2016 Document Reviewed: 02/07/2013 Elsevier Interactive Patient Education  2018 [employer].   Cholelithiasis Cholelithiasis is also called "gallstones." It is a kind of gallbladder disease. The gallbladder is an organ that stores a liquid (bile) that helps you digest fat. Gallstones may not cause symptoms (may be silent gallstones) until they cause a blockage, and then they can cause pain (gallbladder attack). Follow these instructions at home:  Take over-the-counter and prescription medicines only as told by your doctor.  Stay at a healthy weight.  Eat healthy foods. This includes: ? Eating fewer fatty foods, like fried foods. ? Eating fewer refined carbs (refined carbohydrates). Refined carbs are breads and grains that are highly processed, like white bread and white rice. Instead, choose whole grains like whole-wheat bread and brown rice. ? Eating more fiber. Almonds, fresh fruit, and beans are healthy sources of fiber.  Keep all follow-up visits as told by your doctor. This is important. Contact a doctor if:  You have sudden pain in the upper right side of  your belly (abdomen). Pain might spread to your right shoulder or your chest. This may be a sign of a gallbladder attack.  You feel sick to your stomach (are nauseous).  You throw up (vomit).  You have been diagnosed with gallstones that have no symptoms and you get: ? Belly pain. ? Discomfort, burning, or fullness in the upper part of your belly (indigestion). Get help right away if:  You have sudden pain in the upper right side of your belly, and it lasts for more than 2 hours.  You have belly pain that lasts for more than 5 hours.  You have a fever or chills.  You keep feeling sick to your stomach or you keep throwing up.  Your skin or the whites of your eyes turn yellow (jaundice).  You have dark-colored pee (urine).  You have light-colored poop (stool). Summary  Cholelithiasis is also called "gallstones."  The gallbladder is an organ that stores a liquid (bile) that helps you digest fat.  Silent gallstones are gallstones that do not cause symptoms.  A gallbladder attack may cause sudden pain in the upper right side of your belly. Pain might spread to your right shoulder or your chest. If this happens, contact your doctor.  If you have sudden pain in the upper right side of your belly that lasts for more than 2 hours, get help right away. This information is not intended to replace advice given to you by your health care provider. Make sure you discuss any questions you have with your health care provider. Document Released: 02/02/2008 Document Revised: 05/02/2016 Document Reviewed: 05/02/2016 Elsevier Interactive Patient Education  2017 [employer].

## 2017-10-16 ENCOUNTER — Encounter: Payer: Self-pay | Admitting: Surgery

## 2017-10-16 NOTE — Progress Notes (Signed)
Surgical Clinic History and Physical  Referring provider:  Department, [facility] [address] [place], [place] [address]  HISTORY OF PRESENT ILLNESS (HPI):  42 y.o. female presents for evaluation of RUQ > epigastric abdominal pain. With the assistance of [place] translation services, patient reports post-prandial RUQ abdominal pain x ~6 months. She states sometimes she gets heartburn, but her RUQ has not responded to medications she usually takes for heartburn. She has not paid attention to nor noticed which foods make her pain worse, continues to pass routine flatus and BM's WNL, and denies N/V, fever/chills, CP, or SOB. Since her episode which prompted ED evaluation, she has experienced similar intermittent, but less severe, post-prandial abdominal pain.  PAST MEDICAL HISTORY (PMH):  Past Medical History:  Diagnosis Date  . Medical history non-contributory      PAST SURGICAL HISTORY (PSH):  Past Surgical History:  Procedure Laterality Date  . CESAREAN SECTION       MEDICATIONS:  Prior to Admission medications   Medication Sig Start Date End Date Taking? Authorizing Provider  ibuprofen (ADVIL,MOTRIN) 600 MG tablet Take 1 tablet (600 mg total) by mouth every 8 (eight) hours as needed. 07/30/17  Yes Merrily Brittle, MD  ondansetron (ZOFRAN) 4 MG tablet Take 1 tablet (4 mg total) by mouth daily as needed for nausea or vomiting. 07/30/17 07/30/18 Yes Merrily Brittle, MD  omeprazole (PRILOSEC) 20 MG capsule Take 1 capsule (20 mg total) by mouth daily. 10/13/17   Ancil Linsey, MD     ALLERGIES:  No Known Allergies   SOCIAL HISTORY:  Social History   Socioeconomic History  . Marital status: Divorced    Spouse name: Not on file  . Number of children: Not on file  . Years of education: Not on file  . Highest education level: Not on file  Social Needs  . Financial resource strain: Not on file  . Food insecurity - worry: Not on file  . Food  insecurity - inability: Not on file  . Transportation needs - medical: Not on file  . Transportation needs - non-medical: Not on file  Occupational History  . Not on file  Tobacco Use  . Smoking status: Never Smoker  . Smokeless tobacco: Never Used  Substance and Sexual Activity  . Alcohol use: No  . Drug use: No  . Sexual activity: Yes  Other Topics Concern  . Not on file  Social History Narrative  . Not on file    The patient currently resides (home / rehab facility / nursing home): Home The patient normally is (ambulatory / bedbound): Ambulatory  FAMILY HISTORY:  History reviewed. No pertinent family history.  Otherwise negative/non-contributory.  REVIEW OF SYSTEMS:  Constitutional: denies any other weight loss, fever, chills, or sweats  Eyes: denies any other vision changes, history of eye injury  ENT: denies sore throat, hearing problems  Respiratory: denies shortness of breath, wheezing  Cardiovascular: denies chest pain, palpitations  Gastrointestinal: abdominal pain, N/V, and bowel function as per HPI Musculoskeletal: denies any other joint pains or cramps  Skin: Denies any other rashes or skin discolorations Neurological: denies any other headache, dizziness, weakness  Psychiatric: Denies any other depression, anxiety   All other review of systems were otherwise negative   VITAL SIGNS:  BP 136/89   Pulse 98   Temp 98.1 F (36.7 C) (Oral)   Ht 5\' 3"  (1.6 m)   Wt 149 lb 6.4 oz (67.8 kg)   BMI 26.47 kg/m  PHYSICAL EXAM:  Constitutional:  -- Normal body habitus  -- Awake, alert, and oriented x3  Eyes:  -- Pupils equally round and reactive to light  -- No scleral icterus  Ear, nose, throat:  -- No jugular venous distension -- No nasal drainage, bleeding Pulmonary:  -- No crackles  -- Equal breath sounds bilaterally -- Breathing non-labored at rest Cardiovascular:  -- S1, S2 present  -- No pericardial rubs  Gastrointestinal:  -- Abdomen soft,  nontender, non-distended, no guarding/rebound  -- No abdominal masses appreciated, pulsatile or otherwise  Musculoskeletal and Integumentary:  -- Wounds or skin discoloration: None appreciated -- Extremities: B/L UE and LE FROM, hands and feet warm, no edema  Neurologic:  -- Motor function: Intact and symmetric -- Sensation: Intact and symmetric  Labs:  CBC Latest Ref Rng & Units 07/30/2017  WBC 3.6 - 11.0 K/uL 11.9(H)  Hemoglobin 12.0 - 16.0 g/dL 69.614.8  Hematocrit 29.535.0 - 47.0 % 44.1  Platelets 150 - 440 K/uL 292   CMP Latest Ref Rng & Units 07/30/2017  Glucose 65 - 99 mg/dL 284(X122(H)  BUN 6 - 20 mg/dL 16  Creatinine 3.240.44 - 4.011.00 mg/dL 0.270.80  Sodium 253135 - 664145 mmol/L 138  Potassium 3.5 - 5.1 mmol/L 3.9  Chloride 101 - 111 mmol/L 105  CO2 22 - 32 mmol/L 27  Calcium 8.9 - 10.3 mg/dL 9.7    Imaging studies:  Limited RUQ Abdominal Ultrasound (07/30/2017) Cholelithiasis.  No sonographic evidence of acute cholecystitis. Fatty infiltration of the liver.   Assessment/Plan:  42 y.o. female with post-prandial RUQ > epigastric abdominal pain with uncertain provoking foods concerning for symptomatic cholelithiasis > GERD.   - daily omeprazole prescribed for GERD and to reduce possibility pain due to GERD   - patient advised to keep log of foods eaten prior to onset of post-prandial abdominal pain   - if fatty foods tend to precede and appear to provoke post-prandial pain, avoid/minimize fatty foods  - risks, benefits, and alternatives to cholecystectomy discussed if pain attributable to cholelithiasis  - return to clinic in 1 month to discuss anticipated cholecystectomy  - instructed to call if any questions or concerns  All of the above recommendations were discussed via translator services with the patient, and all of patient's questions were answered to her expressed satisfaction.  Thank you for the opportunity to participate in this patient's care.  -- Scherrie GerlachJason E. Earlene Plateravis, MD, RPVI [facility]: [facility] - Partnering for exceptional care. Office: [phone]

## 2017-11-17 ENCOUNTER — Encounter: Payer: Self-pay | Admitting: Surgery

## 2017-11-17 ENCOUNTER — Ambulatory Visit (INDEPENDENT_AMBULATORY_CARE_PROVIDER_SITE_OTHER): Payer: Medicaid Other | Admitting: Surgery

## 2017-11-17 VITALS — BP 131/86 | HR 86 | Temp 98.3°F | Wt 153.0 lb

## 2017-11-17 DIAGNOSIS — K8020 Calculus of gallbladder without cholecystitis without obstruction: Secondary | ICD-10-CM

## 2017-11-17 NOTE — H&P (View-Only) (Signed)
Surgical Clinic Progress/Follow-up Note   HPI:  42 y.o. Female presents to clinic for follow-up evaluation of RUQ and epigastric abdominal pain. With the assistance of [place] translation services, patient reports her epigastric abdominal pain seems to have improved with once daily Prilosec, for which she requests a refill prescription. However, she adds that her epigastric abdominal pain (worse with spicy foods) has not completely resolved, and her RUQ abdominal pain worse after more fatty foods continues to persist. Patient otherwise reports +flatus and +BM WNL, denies NV, fever/chills, CP, or SOB.  Review of Systems:  Constitutional: denies any other weight loss, fever, chills, or sweats  Eyes: denies any other vision changes, history of eye injury  ENT: denies sore throat, hearing problems  Respiratory: denies shortness of breath, wheezing  Cardiovascular: denies chest pain, palpitations  Gastrointestinal: abdominal pain, N/V, and bowel function as per HPI Musculoskeletal: denies any other joint pains or cramps  Skin: Denies any other rashes or skin discolorations  Neurological: denies any other headache, dizziness, weakness  Psychiatric: denies any other depression, anxiety  All other review of systems: otherwise negative   Vital Signs:  BP 131/86   Pulse 86   Temp 98.3 F (36.8 C) (Oral)   Wt 153 lb (69.4 kg)   BMI 27.10 kg/m    Physical Exam:  Constitutional:  -- Normal body habitus  -- Awake, alert, and oriented x3  Eyes:  -- Pupils equally round and reactive to light  -- No scleral icterus  Ear, nose, throat:  -- No jugular venous distension  -- No nasal drainage, bleeding Pulmonary:  -- No crackles -- Equal breath sounds bilaterally -- Breathing non-labored at rest Cardiovascular:  -- S1, S2 present  -- No pericardial rubs  Gastrointestinal:  -- Soft and non-distended with mild-/moderate- RUQ abdominal tenderness, no guarding/rebound tenderness -- No  abdominal masses appreciated, pulsatile or otherwise  Musculoskeletal / Integumentary:  -- Wounds or skin discoloration: None appreciated except well-healed c-section scar -- Extremities: B/L UE and LE FROM, hands and feet warm, no edema  Neurologic:  -- Motor function: intact and symmetric  -- Sensation: intact and symmetric   Laboratory studies:  CBC Latest Ref Rng & Units 07/30/2017  WBC 3.6 - 11.0 K/uL 11.9(H)  Hemoglobin 12.0 - 16.0 g/dL 16.1  Hematocrit 09.6 - 47.0 % 44.1  Platelets 150 - 440 K/uL 292   CMP Latest Ref Rng & Units 07/30/2017  Glucose 65 - 99 mg/dL 045(W)  BUN 6 - 20 mg/dL 16  Creatinine 0.98 - 1.19 mg/dL 1.47  Sodium 829 - 562 mmol/L 138  Potassium 3.5 - 5.1 mmol/L 3.9  Chloride 101 - 111 mmol/L 105  CO2 22 - 32 mmol/L 27  Calcium 8.9 - 10.3 mg/dL 9.7    Imaging:  Imaging studies:  Limited RUQ Abdominal Ultrasound (07/30/2017) Cholelithiasis. No sonographic evidence of acute cholecystitis. Fatty infiltration of the liver.    Assessment:  42 y.o. yo Female with persistent post-prandial RUQ and incompletely improved epigastric abdominal pain, concerning for symptomatic cholelithiasis and GERD.  Plan:   - renewed prescription for once daily omeprazole, will refer to GI             - avoid/minimize foods with higher fat content (meats, cheeses/dairy, and fried)             - prefer low-fat vegetables, whole grains (wheat bread, ceareals, etc), and fruits until cholecystectomy              - all  risks, benefits, and alternatives to cholecystectomy were discussed with the patient, all of his questions were answered to his expressed satisfaction, patient expresses she wishes to proceed, and informed consent was obtained accordingly.             - will plan for upcoming elective outpatient laparoscopic cholecystectomy             - anticipate return to clinic 2 weeks after above planned surgery             - instructed to call if any questions or concerns  All  of the above recommendations were discussed with the patient with assistance from [place] translation services, and all of patient's questions were answered to her expressed satisfaction.  -- Scherrie GerlachJason E. Earlene Plateravis, MD, RPVI [place]: [facility] - Partnering for exceptional care. Office: [phone]

## 2017-11-17 NOTE — Progress Notes (Signed)
Surgical Clinic Progress/Follow-up Note   HPI:  42 y.o. Female presents to clinic for follow-up evaluation of RUQ and epigastric abdominal pain. With the assistance of [place] translation services, patient reports her epigastric abdominal pain seems to have improved with once daily Prilosec, for which she requests a refill prescription. However, she adds that her epigastric abdominal pain (worse with spicy foods) has not completely resolved, and her RUQ abdominal pain worse after more fatty foods continues to persist. Patient otherwise reports +flatus and +BM WNL, denies NV, fever/chills, CP, or SOB.  Review of Systems:  Constitutional: denies any other weight loss, fever, chills, or sweats  Eyes: denies any other vision changes, history of eye injury  ENT: denies sore throat, hearing problems  Respiratory: denies shortness of breath, wheezing  Cardiovascular: denies chest pain, palpitations  Gastrointestinal: abdominal pain, N/V, and bowel function as per HPI Musculoskeletal: denies any other joint pains or cramps  Skin: Denies any other rashes or skin discolorations  Neurological: denies any other headache, dizziness, weakness  Psychiatric: denies any other depression, anxiety  All other review of systems: otherwise negative   Vital Signs:  BP 131/86   Pulse 86   Temp 98.3 F (36.8 C) (Oral)   Wt 153 lb (69.4 kg)   BMI 27.10 kg/m    Physical Exam:  Constitutional:  -- Normal body habitus  -- Awake, alert, and oriented x3  Eyes:  -- Pupils equally round and reactive to light  -- No scleral icterus  Ear, nose, throat:  -- No jugular venous distension  -- No nasal drainage, bleeding Pulmonary:  -- No crackles -- Equal breath sounds bilaterally -- Breathing non-labored at rest Cardiovascular:  -- S1, S2 present  -- No pericardial rubs  Gastrointestinal:  -- Soft and non-distended with mild-/moderate- RUQ abdominal tenderness, no guarding/rebound tenderness -- No  abdominal masses appreciated, pulsatile or otherwise  Musculoskeletal / Integumentary:  -- Wounds or skin discoloration: None appreciated except well-healed c-section scar -- Extremities: B/L UE and LE FROM, hands and feet warm, no edema  Neurologic:  -- Motor function: intact and symmetric  -- Sensation: intact and symmetric   Laboratory studies:  CBC Latest Ref Rng & Units 07/30/2017  WBC 3.6 - 11.0 K/uL 11.9(H)  Hemoglobin 12.0 - 16.0 g/dL 16.1  Hematocrit 09.6 - 47.0 % 44.1  Platelets 150 - 440 K/uL 292   CMP Latest Ref Rng & Units 07/30/2017  Glucose 65 - 99 mg/dL 045(W)  BUN 6 - 20 mg/dL 16  Creatinine 0.98 - 1.19 mg/dL 1.47  Sodium 829 - 562 mmol/L 138  Potassium 3.5 - 5.1 mmol/L 3.9  Chloride 101 - 111 mmol/L 105  CO2 22 - 32 mmol/L 27  Calcium 8.9 - 10.3 mg/dL 9.7    Imaging:  Imaging studies:  Limited RUQ Abdominal Ultrasound (07/30/2017) Cholelithiasis. No sonographic evidence of acute cholecystitis. Fatty infiltration of the liver.    Assessment:  42 y.o. yo Female with persistent post-prandial RUQ and incompletely improved epigastric abdominal pain, concerning for symptomatic cholelithiasis and GERD.  Plan:   - renewed prescription for once daily omeprazole, will refer to GI             - avoid/minimize foods with higher fat content (meats, cheeses/dairy, and fried)             - prefer low-fat vegetables, whole grains (wheat bread, ceareals, etc), and fruits until cholecystectomy              - all  risks, benefits, and alternatives to cholecystectomy were discussed with the patient, all of his questions were answered to his expressed satisfaction, patient expresses she wishes to proceed, and informed consent was obtained accordingly.             - will plan for upcoming elective outpatient laparoscopic cholecystectomy             - anticipate return to clinic 2 weeks after above planned surgery             - instructed to call if any questions or concerns  All  of the above recommendations were discussed with the patient with assistance from [place] translation services, and all of patient's questions were answered to her expressed satisfaction.  -- Scherrie GerlachJason E. Earlene Plateravis, MD, RPVI [place]: [facility] - Partnering for exceptional care. Office: [phone]

## 2017-11-17 NOTE — Patient Instructions (Addendum)
Chng ti s? gi?i thi?u b?n ??n bc s? tiu ha. H? s? g?i cho b?n v?i m?t ngy h?n v th?i gian.  Chng ti s? g?i ??n thu?c c?a b?n ??n nh thu?c c?a b?n.    Th? thu?t c?t ti m?t b?ng n?i soi ? b?ng Laparoscopic Cholecystectomy Th? thu?t c?t ti m?t b?ng n?i soi ? b?ng l ph?u thu?t c?t b? ti m?t. Ti m?t l m?t b? ph?n c hnh qu? l n?m d??i gan ? bn ph?i c? th?. Ti m?t d? tr? m?t, m?t ch?t d?ch gip c? th? tiu ha ch?t bo. Th? thu?t c?t ti m?t th??ng ???c th?c hi?n ?? tr? tnh tr?ng vim ? ti m?t (vim ti m?t). Tnh tr?ng ny th??ng l do tch t? s?i m?t s?i m?t) trong ti m?t. S?i m?t c th? lm t?c dng ch?y c?a m?t, c th? d?n ??n vim v ?au. Trong nh?ng tr??ng h?p n?ng, c th? c?n ph?i m? c?p c?u. Th? thu?t ny ???c th?c hi?n thng qua cc v?t m? nh? ? b?ng qu v? (ph?u thu?t n?i soi ? b?ng). M?t ?ng m?nh g?n camera (?ng soi ? b?ng) ???c ??a vo thng qua m?t v?t m?. Cc d?ng c? ph?u thu?t m?nh ???c lu?n qua cc v?t m? khc. Trong m?t s? tr??ng h?p, th? thu?t n?i soi ? b?ng c th? ???c chuy?n thnh ki?u ph?u thu?t ???c th?c hi?n thng qua m?t v?t m? r?ng h?n (ph?u thu?t m?). Hy cho chuyn gia ch?m [place] s?c kh?e bi?t v?:  B?t k? v?n ?? d? ?ng no m qu v? c.  T?t c? cc lo?i thu?c m qu v? ?ang s? d?ng, bao g?m c? vitamin, th?o d??c, thu?c nh? m?t, thu?c d?ng kem v thu?c khng k ??n.  B?t k? v?n ?? g m qu v? ho?c cc thnh vin trong gia ?nh ? g?p ph?i v?i thu?c gy m.  B?t k? b?nh l v? mu no m qu v? ? b?.  B?t k? l?n ph?u thu?t no qu v? ? c.  B?t k? tnh tr?ng b?nh l no m qu v? c.  Li?u qu v? c ?ang mang thai ho?c c th? c thai hay khng. Cc nguy c? l g? Ni chung, ?y l m?t th? thu?t an ton. Tuy nhin, cc v?n ?? c th? x?y ra, bao g?m:  Nhi?m trng.  Ch?y mu.  Ph?n ?ng di? ??ng v?i thu?c.  Gy th??ng t?n ca?c c?u tru?c ho??c c? quan kha?c.  S?i cn st l?i trong ?ng m?t ch?. ?ng m?t ch? d?n m?t t? ti m?t vo ru?t non.  R  r? m?t t? ?ng m?t ?ang ???c k?p khi ti m?t ???c c?t b?.  ?i?u g x?y ra tr??c khi lm th? thu?t? Hy ?? c? th? ?? n??c. Tun th? ch? d?n c?a chuyn gia ch?m [place] s?c kh?e v? duy tr ?? n??c, c th? bao g?m:  T?i ?a 2 ti?ng tr??c khi lm th? thu?t - qu v? c th? ti?p t?c u?ng ?? l?ng trong, ch?ng h?n nh? n??c, n??c p tri cy trong, c ph ?en v tr nguyn ch?t.  Nh?ng h?n ch? v? ?n v u?ng Tun th? ch? d?n c?a chuyn gia ch?m [place] s?c kh?e v? ?n v u?ng, c th? bao g?m:  8 gi? tr??c khi ti?n hnh th? thu?t - d?ng ?n b?a ?n ho?c th?c ph?m kh tiu nh? th?t, th?c ph?m chin/rn, ho?c th?c ph?m nhi?u ch?t bo.  6 gi? tr??c khi ti?n hnh th? thu?t -  d?ng ?n cc b?a ?n nh?, ho?c cc lo?i th?c ph?m nh? bnh m n??ng ho?c ng? c?c.  6 gi? tr??c khi ti?n hnh th? thu?t - d?ng u?ng s?a ho?c ?? u?ng c s?a.  2 gi? tr??c khi ti?n hnh th? thu?t - d?ng u?ng ?? l?ng trong.  Thu?c  Hy h?i chuyn gia ch?m [place] s?c kh?e v?: ? Vi?c thay ??i ho?c d?ng s? d?ng cc lo?i thu?c th??ng xuyn dng c?a qu v?. ?i?u ny ??c bi?t quan tr?ng n?u qu v? ?ang dng thu?c ?i?u tr? ti?u ???ng ho?c thu?c lm long mu. ? ?ang dng cc lo?i thu?c nh? aspirin v ibuprofen. Nh?ng thu?c ny c th? lm long mu. Khng dng nh?ng lo?i thu?c ny tr??c khi lm th? thu?t n?u chuyn gia ch?m [place] s?c kh?e khuyn qu v? khng dng.  Qu v? c th? ???c cho dng khng sinh ?? gip ng?n ng?a nhi?m trng. H??ng d?n chung  Hy cho chuyn gia ch?m [place] s?c kh?e bi?t qu v? c b? c?m l?nh hay b?t k? nhi?m trng no tr??c khi lm ph?u thu?t hay khng.  C k? ho?ch nh? ai ? ??a quy? vi? t? b?nh vi?n ho?c t? phng khm v? nh.  Hy h?i chuyn gia ch?m [place] s?c kh?e v? vi?c v?t m? c?a qu v? s? ???c ?nh d?u ho?c xc ??nh nh? th? no. ?i?u g x?y ra trong qu trnh th?c hi?n th? thu?t?  ?? gi?m nguy c? nhi?m trng: ? ??i ng? nhn vin y t? s? r?a ho?c st trng tay c?a h?. ? Da c?a qu v? s? ???c r?a b?ng x phng. ? Lng ? vng ph?u  thu?t c th? ???c c?o s?ch.  Qu v? c th? ???c ??t m?t ???ng truy?n t?nh m?ch (IV) vo m?t trong cc t?nh m?ch.  Qu v? s? ???c cho dng m?t ho?c nhi?u trong s? nh?ng lo?i thu?c sau: ? Thu?c ?? gip quy? vi? th? gin (thu?c an th?n). ? Thu?c lm qu v? ng? (thu?c gy m toa?n thn).  M?t ?ng th? s? ???c ??t vo trong mi?ng qu v?.  Bc s? ph?u thu?t s? t?o nhi?u v?t r?ch nh? (v?t m?) ? ? b?ng qu v?.  ?ng soi ? b?ng s? ???c lu?n vo thng qua m?t trong cc v?t m? nh?. Camera trn ?ng soi ? b?ng s? g?i cc hnh ?nh ??n m?t mn hnh TV (mn hnh) trong phng m?Marland Kitchen. Vi?c ny cho php bc s? ph?u thu?t nhn th?y bn trong ? b?ng c?a qu v?.  M?t lo?i kh gi?ng khng kh s? ???c b?m vo bn trong ? b?ng qu v?. ?i?u ny s? lm b?ng qu v? to ln v gip bc s? ph?u thu?t c thm khng gian ?? th?c hi?n ph?u thu?t.  Cc d?ng c? khc c?n thi?t cho th? thu?t s? ???c ??a vo thng qua cc v?t r?ch khc. Ti m?t s? ???c l?y ra qua m?t trong cc v?t r?ch ny.  Ti m?t ch? c?a qu v? c th? ???c ki?m tra. N?u th?y s?i trong ?ng m?t ch?, th c th? l?y s?i ra.  Sau khi ti m?t c?a qu v? ? ???c c?t b?, cc v?t m? s? ???c ?ng l?i b?ng cc m?i khu (ch? ph?u thu?t), ghim khu ph?u thu?t ho?c keo dn da.  Cc v?t m? c?a qu v? c th? ???c ph? b?ng b?ng (b?ng g?c). Th? thu?t ny c th? khc nhau gi?a cc chuyn gia ch?m  s?c kh?e v cc b?nh vi?n. ?  i?u g x?y ra sau khi lm th? thu?t?  Huy?t p, nh?p tim, nh?p th? v n?ng ??  xi trong mu c?a qu v? s? ???c theo di cho ??n khi thu?c qu v? ? dng h?t tc d?ng.  Qu v? s? ???c cho thu?c ?? ki?m sot c?n ?au n?u c?n.  Khng li xe trong vng 24 gi? n?u qu v? ? ???c cho dng thu?c an th?n. Thng tin ny khng nh?m m?c ?ch thay th? cho l?i khuyn m chuyn gia ch?m [place] s?c kh?e ni v?i qu v?. Hy b?o ??m qu v? ph?i th?o lu?n b?t k? v?n ?? g m qu v? c v?i chuyn gia ch?m [place] s?c kh?e c?a qu v?. Document Released: 12/08/2015 Document  Revised: 12/02/2016 Document Reviewed: 02/02/2016 Elsevier Interactive Patient Education  2018 [employer].

## 2017-11-24 ENCOUNTER — Telehealth: Payer: Self-pay | Admitting: Surgery

## 2017-11-24 ENCOUNTER — Inpatient Hospital Stay: Admission: RE | Admit: 2017-11-24 | Payer: Self-pay | Source: Ambulatory Visit

## 2017-11-24 NOTE — Telephone Encounter (Signed)
Interpreter service has called patient 2 times to advise her of the information below. No answer. Voicemail has been left by [employer]. She did leave the number to pre admit testing if the patient needs to reschedule and the number to our office.   pre op date/time and sx date. Sx: 11/30/17 with Dr Davis--laparoscopic cholecystectomy.  Pre op: 11/24/17 @ 2:30pm--office interview.

## 2017-11-29 MED ORDER — CEFAZOLIN SODIUM-DEXTROSE 2-4 GM/100ML-% IV SOLN
2.0000 g | INTRAVENOUS | Status: AC
  Administered 2017-11-30: 2 g via INTRAVENOUS

## 2017-11-29 NOTE — Telephone Encounter (Signed)
Patient has been contacted and has agreed to arrive at 8:30 am to pre admission testing tomorrow prior to surgery. Patient will then be taken to SDS for procedure. The same day surgery nurse has discussed all pre care information with the interpreter and the patient.

## 2017-11-30 ENCOUNTER — Encounter
Admission: RE | Disposition: A | Discharge status: Home / Self Care | Payer: Self-pay | Source: Ambulatory Visit | Attending: Surgery

## 2017-11-30 ENCOUNTER — Other Ambulatory Visit: Payer: Self-pay

## 2017-11-30 ENCOUNTER — Ambulatory Visit: Payer: Medicaid Other | Admitting: Anesthesiology

## 2017-11-30 ENCOUNTER — Ambulatory Visit
Admission: RE | Admit: 2017-11-30 | Discharge: 2017-11-30 | Disposition: A | Discharge status: Home / Self Care | Payer: Medicaid Other | Source: Ambulatory Visit | Attending: Surgery | Admitting: Surgery

## 2017-11-30 DIAGNOSIS — Z79899 Other long term (current) drug therapy: Secondary | ICD-10-CM | POA: Insufficient documentation

## 2017-11-30 DIAGNOSIS — K660 Peritoneal adhesions (postprocedural) (postinfection): Secondary | ICD-10-CM | POA: Diagnosis not present

## 2017-11-30 DIAGNOSIS — K8020 Calculus of gallbladder without cholecystitis without obstruction: Secondary | ICD-10-CM | POA: Insufficient documentation

## 2017-11-30 HISTORY — PX: CHOLECYSTECTOMY: SHX55

## 2017-11-30 LAB — CBC WITH DIFFERENTIAL/PLATELET
BASOS PCT: 0 %
BLASTS: 0 %
Band Neutrophils: 0 %
Basophils Absolute: 0 10*3/uL (ref 0–0.1)
Eosinophils Absolute: 1.3 10*3/uL — ABNORMAL HIGH (ref 0–0.7)
Eosinophils Relative: 16 %
HEMATOCRIT: 44.8 % (ref 35.0–47.0)
HEMOGLOBIN: 14.9 g/dL (ref 12.0–16.0)
LYMPHS PCT: 40 %
Lymphs Abs: 3.2 10*3/uL (ref 1.0–3.6)
MCH: 30 pg (ref 26.0–34.0)
MCHC: 33.1 g/dL (ref 32.0–36.0)
MCV: 90.6 fL (ref 80.0–100.0)
MONO ABS: 0.7 10*3/uL (ref 0.2–0.9)
MYELOCYTES: 0 %
Metamyelocytes Relative: 0 %
Monocytes Relative: 8 %
NEUTROS PCT: 36 %
NRBC: 0 /100{WBCs}
Neutro Abs: 3 10*3/uL (ref 1.4–6.5)
Other: 0 %
PROMYELOCYTES ABS: 0 %
Platelets: 265 10*3/uL (ref 150–440)
RBC: 4.95 MIL/uL (ref 3.80–5.20)
RDW: 13.2 % (ref 11.5–14.5)
WBC: 8.2 10*3/uL (ref 3.6–11.0)

## 2017-11-30 LAB — POCT PREGNANCY, URINE: Preg Test, Ur: NEGATIVE

## 2017-11-30 LAB — COMPREHENSIVE METABOLIC PANEL
ALK PHOS: 59 U/L (ref 38–126)
ALT: 15 U/L (ref 14–54)
AST: 18 U/L (ref 15–41)
Albumin: 4 g/dL (ref 3.5–5.0)
Anion gap: 7 (ref 5–15)
BILIRUBIN TOTAL: 0.4 mg/dL (ref 0.3–1.2)
BUN: 15 mg/dL (ref 6–20)
CALCIUM: 9 mg/dL (ref 8.9–10.3)
CO2: 26 mmol/L (ref 22–32)
CREATININE: 0.67 mg/dL (ref 0.44–1.00)
Chloride: 108 mmol/L (ref 101–111)
GFR calc non Af Amer: >60 mL/min (ref >60)
Glucose, Bld: 108 mg/dL — ABNORMAL HIGH (ref 65–99)
Potassium: 4 mmol/L (ref 3.5–5.1)
SODIUM: 141 mmol/L (ref 135–145)
Total Protein: 7.9 g/dL (ref 6.5–8.1)

## 2017-11-30 SURGERY — LAPAROSCOPIC CHOLECYSTECTOMY
Anesthesia: General | Wound class: Clean Contaminated

## 2017-11-30 MED ORDER — OXYCODONE HCL 5 MG PO TABS
ORAL_TABLET | ORAL | Status: AC
  Filled 2017-11-30: qty 1

## 2017-11-30 MED ORDER — FENTANYL CITRATE (PF) 100 MCG/2ML IJ SOLN
INTRAMUSCULAR | Status: AC
  Administered 2017-11-30: 25 ug via INTRAVENOUS
  Filled 2017-11-30: qty 2

## 2017-11-30 MED ORDER — FENTANYL CITRATE (PF) 100 MCG/2ML IJ SOLN
INTRAMUSCULAR | Status: DC | PRN
  Administered 2017-11-30 (×2): 50 ug via INTRAVENOUS

## 2017-11-30 MED ORDER — MEPERIDINE HCL 50 MG/ML IJ SOLN: 6.2500 mg | INTRAMUSCULAR | Status: DC | PRN

## 2017-11-30 MED ORDER — PHENYLEPHRINE HCL 10 MG/ML IJ SOLN
INTRAMUSCULAR | Status: DC | PRN
  Administered 2017-11-30: 200 ug via INTRAVENOUS

## 2017-11-30 MED ORDER — OXYCODONE HCL 5 MG/5ML PO SOLN: 5.0000 mg | Freq: Once | ORAL | Status: AC | PRN

## 2017-11-30 MED ORDER — CHLORHEXIDINE GLUCONATE CLOTH 2 % EX PADS: 6.0000 | MEDICATED_PAD | Freq: Once | CUTANEOUS | Status: DC

## 2017-11-30 MED ORDER — LIDOCAINE HCL (CARDIAC) 20 MG/ML IV SOLN
INTRAVENOUS | Status: DC | PRN
  Administered 2017-11-30: 80 mg via INTRAVENOUS

## 2017-11-30 MED ORDER — LIDOCAINE HCL 1 % IJ SOLN
INTRAMUSCULAR | Status: DC | PRN
  Administered 2017-11-30: 16 mL

## 2017-11-30 MED ORDER — OXYCODONE-ACETAMINOPHEN 5-325 MG PO TABS: 1.0000 | ORAL_TABLET | ORAL | 0 refills | Status: DC | PRN

## 2017-11-30 MED ORDER — FENTANYL CITRATE (PF) 100 MCG/2ML IJ SOLN
INTRAMUSCULAR | Status: AC
  Filled 2017-11-30: qty 2

## 2017-11-30 MED ORDER — OXYCODONE HCL 5 MG PO TABS
5.0000 mg | ORAL_TABLET | Freq: Once | ORAL | Status: AC | PRN
  Administered 2017-11-30: 5 mg via ORAL

## 2017-11-30 MED ORDER — PROPOFOL 10 MG/ML IV BOLUS
INTRAVENOUS | Status: AC
Start: 2017-11-30 — End: ?
  Filled 2017-11-30: qty 20

## 2017-11-30 MED ORDER — FAMOTIDINE 20 MG PO TABS
ORAL_TABLET | ORAL | Status: AC
  Administered 2017-11-30: 20 mg via ORAL
  Filled 2017-11-30: qty 1

## 2017-11-30 MED ORDER — PROMETHAZINE HCL 25 MG/ML IJ SOLN: 6.2500 mg | INTRAMUSCULAR | Status: DC | PRN

## 2017-11-30 MED ORDER — SUGAMMADEX SODIUM 200 MG/2ML IV SOLN
INTRAVENOUS | Status: DC | PRN
  Administered 2017-11-30: 200 mg via INTRAVENOUS

## 2017-11-30 MED ORDER — ONDANSETRON HCL 4 MG/2ML IJ SOLN
INTRAMUSCULAR | Status: DC | PRN
  Administered 2017-11-30: 4 mg via INTRAVENOUS

## 2017-11-30 MED ORDER — CEFAZOLIN SODIUM-DEXTROSE 2-4 GM/100ML-% IV SOLN
INTRAVENOUS | Status: AC
  Filled 2017-11-30: qty 100

## 2017-11-30 MED ORDER — DEXAMETHASONE SODIUM PHOSPHATE 10 MG/ML IJ SOLN
INTRAMUSCULAR | Status: DC | PRN
  Administered 2017-11-30: 6 mg via INTRAVENOUS

## 2017-11-30 MED ORDER — ROCURONIUM BROMIDE 100 MG/10ML IV SOLN
INTRAVENOUS | Status: DC | PRN
  Administered 2017-11-30: 50 mg via INTRAVENOUS

## 2017-11-30 MED ORDER — CHLORHEXIDINE GLUCONATE CLOTH 2 % EX PADS
6.0000 | MEDICATED_PAD | Freq: Once | CUTANEOUS | Status: AC
  Administered 2017-11-30: 6 via TOPICAL

## 2017-11-30 MED ORDER — BUPIVACAINE HCL (PF) 0.5 % IJ SOLN
INTRAMUSCULAR | Status: AC
  Filled 2017-11-30: qty 30

## 2017-11-30 MED ORDER — PROPOFOL 10 MG/ML IV BOLUS
INTRAVENOUS | Status: DC | PRN
  Administered 2017-11-30: 140 mg via INTRAVENOUS

## 2017-11-30 MED ORDER — FENTANYL CITRATE (PF) 100 MCG/2ML IJ SOLN
25.0000 ug | INTRAMUSCULAR | Status: DC | PRN
  Administered 2017-11-30 (×4): 25 ug via INTRAVENOUS

## 2017-11-30 MED ORDER — MIDAZOLAM HCL 2 MG/2ML IJ SOLN
INTRAMUSCULAR | Status: AC
  Filled 2017-11-30: qty 2

## 2017-11-30 MED ORDER — KETOROLAC TROMETHAMINE 30 MG/ML IJ SOLN
INTRAMUSCULAR | Status: DC | PRN
  Administered 2017-11-30: 30 mg via INTRAVENOUS

## 2017-11-30 MED ORDER — LACTATED RINGERS IV SOLN
INTRAVENOUS | Status: DC
  Administered 2017-11-30: 09:00:00 via INTRAVENOUS

## 2017-11-30 MED ORDER — LIDOCAINE HCL (PF) 1 % IJ SOLN
INTRAMUSCULAR | Status: AC
  Filled 2017-11-30: qty 30

## 2017-11-30 MED ORDER — FAMOTIDINE 20 MG PO TABS
20.0000 mg | ORAL_TABLET | Freq: Once | ORAL | Status: AC
  Administered 2017-11-30: 20 mg via ORAL

## 2017-11-30 SURGICAL SUPPLY — 34 items
APPLIER CLIP ROT 10 11.4 M/L (STAPLE) ×3
CHLORAPREP W/TINT 26ML (MISCELLANEOUS) ×3 IMPLANT
CLIP APPLIE ROT 10 11.4 M/L (STAPLE) ×1 IMPLANT
DECANTER SPIKE VIAL GLASS SM (MISCELLANEOUS) ×6 IMPLANT
DERMABOND ADVANCED (GAUZE/BANDAGES/DRESSINGS) ×2
DERMABOND ADVANCED .7 DNX12 (GAUZE/BANDAGES/DRESSINGS) ×1 IMPLANT
DRESSING SURGICEL FIBRLLR 1X2 (HEMOSTASIS) IMPLANT
DRSG SURGICEL FIBRILLAR 1X2 (HEMOSTASIS)
ELECT REM PT RETURN 9FT ADLT (ELECTROSURGICAL) ×3
ELECTRODE REM PT RTRN 9FT ADLT (ELECTROSURGICAL) ×1 IMPLANT
GLOVE BIO SURGEON STRL SZ7 (GLOVE) ×3 IMPLANT
GLOVE BIOGEL PI IND STRL 7.5 (GLOVE) ×1 IMPLANT
GLOVE BIOGEL PI INDICATOR 7.5 (GLOVE) ×2
GOWN STRL REUS W/ TWL LRG LVL3 (GOWN DISPOSABLE) ×3 IMPLANT
GOWN STRL REUS W/TWL LRG LVL3 (GOWN DISPOSABLE) ×6
GRASPER SUT TROCAR 14GX15 (MISCELLANEOUS) ×3 IMPLANT
IRRIGATION STRYKERFLOW (MISCELLANEOUS) IMPLANT
IRRIGATOR STRYKERFLOW (MISCELLANEOUS)
IV NS 1000ML (IV SOLUTION) ×2
IV NS 1000ML BAXH (IV SOLUTION) ×1 IMPLANT
KIT TURNOVER KIT A (KITS) ×3 IMPLANT
NEEDLE HYPO 22GX1.5 SAFETY (NEEDLE) ×3 IMPLANT
NEEDLE INSUFFLATION 14GA 120MM (NEEDLE) ×3 IMPLANT
NS IRRIG 1000ML POUR BTL (IV SOLUTION) ×3 IMPLANT
PACK LAP CHOLECYSTECTOMY (MISCELLANEOUS) ×3 IMPLANT
POUCH SPECIMEN RETRIEVAL 10MM (ENDOMECHANICALS) ×3 IMPLANT
SCISSORS METZENBAUM CVD 33 (INSTRUMENTS) IMPLANT
SLEEVE ENDOPATH XCEL 5M (ENDOMECHANICALS) ×6 IMPLANT
SUT MNCRL AB 4-0 PS2 18 (SUTURE) ×3 IMPLANT
SUT VICRYL 0 UR6 27IN ABS (SUTURE) ×3 IMPLANT
SUT VICRYL AB 3-0 FS1 BRD 27IN (SUTURE) ×3 IMPLANT
TROCAR XCEL NON-BLD 11X100MML (ENDOMECHANICALS) ×3 IMPLANT
TROCAR XCEL NON-BLD 5MMX100MML (ENDOMECHANICALS) ×3 IMPLANT
TUBING INSUFFLATION (TUBING) ×3 IMPLANT

## 2017-11-30 NOTE — Discharge Instructions (Addendum)
In addition to included general post-operative instructions for Laparoscopic Cholecystectomy,  Diet: Resume home heart healthy diet (as discussed).   Activity: No heavy lifting >20 pounds (children, pets, laundry, garbage) or strenuous activity until follow-up, but light activity and walking are encouraged. Do not drive or drink alcohol if taking narcotic pain medications.  Wound care: 2 days after surgery (Friday, 4/5), may shower/get incision wet with soapy water and pat dry (do not rub incisions), but no baths or submerging incision underwater until follow-up.   Medications: Resume all home medications. For mild to moderate pain: acetaminophen (Tylenol) or ibuprofen/naproxen (if no kidney disease). Combining Tylenol with alcohol can substantially increase your risk of causing liver disease. Narcotic pain medications, if prescribed, can be used for severe pain, though may cause nausea, constipation, and drowsiness. Do not combine Tylenol and Percocet (or similar) within a 6 hour period as Percocet (and similar) contain(s) Tylenol. If you do not need the narcotic pain medication, you do not need to fill the prescription.  Call office [phone]) at any time if any questions, worsening pain, fevers/chills, bleeding, drainage from incision site, or other concerns.    AMBULATORY SURGERY  DISCHARGE INSTRUCTIONS   1) The drugs that you were given will stay in your system until tomorrow so for the next 24 hours you should not:  A) Drive an automobile B) Make any legal decisions C) Drink any alcoholic beverage   2) You may resume regular meals tomorrow.  Today it is better to start with liquids and gradually work up to solid foods.  You may eat anything you prefer, but it is better to start with liquids, then soup and crackers, and gradually work up to solid foods.   3) Please notify your doctor immediately if you have any unusual bleeding, trouble breathing, redness and pain at the  surgery site, drainage, fever, or pain not relieved by medication.    4) Additional Instructions:        Please contact your physician with any problems or Same Day Surgery at [phone], Monday through Friday 6 am to 4 pm, or [place] at [facility] Main number at [phone].

## 2017-11-30 NOTE — Anesthesia Preprocedure Evaluation (Signed)
Anesthesia Evaluation  Patient identified by MRN, date of birth, ID band Patient awake    Reviewed: Allergy & Precautions, NPO status , Patient's Chart, lab work & pertinent test results  History of Anesthesia Complications Negative for: history of anesthetic complications  Airway Mallampati: II  TM Distance: >3 FB Neck ROM: Full    Dental no notable dental hx.    Pulmonary neg pulmonary ROS, neg sleep apnea, neg COPD,    breath sounds clear to auscultation- rhonchi (-) wheezing      Cardiovascular Exercise Tolerance: Good (-) hypertension(-) CAD and (-) Past MI  Rhythm:Regular Rate:Normal - Systolic murmurs and - Diastolic murmurs    Neuro/Psych negative neurological ROS  negative psych ROS   GI/Hepatic negative GI ROS, Neg liver ROS,   Endo/Other  negative endocrine ROSneg diabetes  Renal/GU negative Renal ROS     Musculoskeletal negative musculoskeletal ROS (+)   Abdominal (+) - obese,   Peds  Hematology negative hematology ROS (+)   Anesthesia Other Findings   Reproductive/Obstetrics                             Anesthesia Physical Anesthesia Plan  ASA: I  Anesthesia Plan: General   Post-op Pain Management:    Induction: Intravenous  PONV Risk Score and Plan: 2  Airway Management Planned: Oral ETT  Additional Equipment:   Intra-op Plan:   Post-operative Plan: Extubation in OR  Informed Consent: I have reviewed the patients History and Physical, chart, labs and discussed the procedure including the risks, benefits and alternatives for the proposed anesthesia with the patient or authorized representative who has indicated his/her understanding and acceptance.   Dental advisory given  Plan Discussed with: CRNA and Anesthesiologist  Anesthesia Plan Comments:         Anesthesia Quick Evaluation

## 2017-11-30 NOTE — Op Note (Signed)
SURGICAL OPERATIVE REPORT   DATE OF PROCEDURE: 11/30/2017  ATTENDING Surgeon(s): Ancil Linseyavis, Dazaria Macneill Evan, MD  ASSISTANT(S): Orlean PattenNaomi Landry, PA-S   ANESTHESIA: GETA  PRE-OPERATIVE DIAGNOSIS: Symptomatic Cholelithiasis (K80.20)  POST-OPERATIVE DIAGNOSIS: Symptomatic Cholelithiasis (K80.20)  PROCEDURE(S): (cpt's: 47562) 1.) Laparoscopic Cholecystectomy  INTRAOPERATIVE FINDINGS: Minimal pericholecystic inflammation with cystic duct and cystic artery clips well-secured, hemostasis at completion of procedure  INTRAOPERATIVE FLUIDS: 1000 mL crystalloid   ESTIMATED BLOOD LOSS: Minimal (<30 mL)   URINE OUTPUT: No foley  SPECIMENS: Gallbladder  IMPLANTS: None  DRAINS: None   COMPLICATIONS: None apparent   CONDITION AT COMPLETION: Hemodynamically stable and extubated  DISPOSITION: PACU   INDICATION(S) FOR PROCEDURE:  Patient is a 42 y.o. female who recently presented with post-prandial RUQ > epigastric abdominal pain after eating fatty foods in particular. Ultrasound suggested cholelithiasis without cholecystitis. All risks, benefits, and alternatives to above elective procedures were discussed with the patient, who elected to proceed, and informed consent was accordingly obtained at that time.   DETAILS OF PROCEDURE:  Patient was brought to the operating suite and appropriately identified. General anesthesia was administered along with peri-operative prophylactic IV antibiotics, and endotracheal intubation was performed by anesthesiologist, along with NG/OG tube for gastric decompression. In supine position, operative site was prepped and draped in usual sterile fashion, and following a brief time out, initial 5 mm incision was made in a natural skin crease just above the umbilicus. Fascia was then elevated, and a Verress needle was inserted and its proper position confirmed using aspiration and saline meniscus test.  Upon insufflation of the abdominal cavity with carbon dioxide to a  well-tolerated pressure of 12-15 mmHg, 5 mm peri-umbilical port followed by laparoscope were inserted and used to inspect the abdominal cavity and its contents with no injuries from insertion of the first trochar noted. Three additional trocars were inserted, one at the epigastric position (10 mm) and two along the Right costal margin (5 mm). The table was then placed in reverse Trendelenburg position with the Right side up. Filmy adhesions between the gallbladder and omentum/duodenum/transverse colon were lysed using combined blunt dissection and selective electrocautery. The apex/dome of the gallbladder was grasped with an atraumatic grasper passed through the lateral port and retracted apically over the liver. The infundibulum was also grasped and retracted, exposing Calot's triangle. The peritoneum overlying the gallbladder infundibulum was incised and dissected free of surrounding peritoneal attachments, revealing the cystic duct and cystic artery, which were clipped twice on the patient side and once on the gallbladder specimen side close to the gallbladder. The gallbladder was then dissected from its peritoneal attachments to the liver using electrocautery, and the gallbladder was placed into a laparoscopic specimen bag and removed from the abdominal cavity via the epigastric port site. Hemostasis and secure placement of clips were confirmed, and intra-peritoneal cavity was inspected with no additional findings. PMI laparoscopic fascial closure device was then used to re-approximate fascia at the 10 mm epigastric port site.  All ports were then removed under direct visualization, and abdominal cavity was desuflated. All port sites were irrigated/cleaned, additional local anesthetic was injected at each incision, 3-0 Vicryl was used to re-approximate dermis at 10 mm port site(s), and subcuticular 4-0 Monocryl suture was used to re-approximate skin. Skin was then cleaned, dried, and sterile skin glue was  applied. Patient was then safely able to be awakened, extubated, and transferred to PACU for post-operative monitoring and care.   I was present for all aspects of the above procedure, and no operative  complications were apparent.

## 2017-11-30 NOTE — Interval H&P Note (Deleted)
History and Physical Interval Note:  11/30/2017 11:09 AM  Ut Megan CooperNga T Hosmer  has presented today for surgery, with the diagnosis of SYMPTOMATIC CHOLELITHIASIS  The various methods of treatment have been discussed with the patient and family. After consideration of risks, benefits and other options for treatment, the patient has consented to  Procedure(s): LAPAROSCOPIC CHOLECYSTECTOMY (N/A) as a surgical intervention .  The patient's history has been reviewed, patient examined, no change in status, stable for surgery.  I have reviewed the patient's chart and labs.  Questions were answered to the patient's satisfaction.     Ancil LinseyJason Evan Juliano Mceachin

## 2017-11-30 NOTE — Anesthesia Postprocedure Evaluation (Signed)
Anesthesia Post Note  Patient: Megan SchwartzUt Nga T Akens  Procedure(s) Performed: LAPAROSCOPIC CHOLECYSTECTOMY (N/A )  Patient location during evaluation: PACU Anesthesia Type: General Level of consciousness: awake and alert and oriented Pain management: pain level controlled Vital Signs Assessment: post-procedure vital signs reviewed and stable Respiratory status: spontaneous breathing, nonlabored ventilation and respiratory function stable Cardiovascular status: blood pressure returned to baseline and stable Postop Assessment: no signs of nausea or vomiting Anesthetic complications: no     Last Vitals:  Vitals:   11/30/17 1321 11/30/17 1332  BP:  127/72  Pulse: 79 79  Resp: 12 14  Temp: 36.5 C 36.8 C  SpO2: 100% 100%    Last Pain:  Vitals:   11/30/17 1332  TempSrc: Temporal  PainSc: 8                  Akita Maxim

## 2017-11-30 NOTE — Anesthesia Post-op Follow-up Note (Signed)
Anesthesia QCDR form completed.        

## 2017-11-30 NOTE — OR Nursing (Signed)
Discharge pending daughter's arrival.

## 2017-11-30 NOTE — Transfer of Care (Signed)
Immediate Anesthesia Transfer of Care Note  Patient: Megan SchwartzUt Nga T Knight  Procedure(s) Performed: LAPAROSCOPIC CHOLECYSTECTOMY (N/A )  Patient Location: PACU  Anesthesia Type:General  Level of Consciousness: sedated  Airway & Oxygen Therapy: Patient Spontanous Breathing and Patient connected to face mask oxygen  Post-op Assessment: Report given to RN  Post vital signs: Reviewed and stable  Last Vitals:  Vitals Value Taken Time  BP 121/67 11/30/2017 12:44 PM  Temp    Pulse 83 11/30/2017 12:44 PM  Resp 14 11/30/2017 12:44 PM  SpO2 100 % 11/30/2017 12:44 PM  Vitals shown include unvalidated device data.  Last Pain:  Vitals:   11/30/17 0845  TempSrc: Temporal         Complications: No apparent anesthesia complications

## 2017-11-30 NOTE — Anesthesia Procedure Notes (Signed)
Procedure Name: Intubation Date/Time: 11/30/2017 11:15 AM Performed by: Philbert Riser, CRNA Pre-anesthesia Checklist: Patient identified, Emergency Drugs available, Suction available, Patient being monitored and Timeout performed Patient Re-evaluated:Patient Re-evaluated prior to induction Oxygen Delivery Method: Circle system utilized Preoxygenation: Pre-oxygenation with 100% oxygen Induction Type: IV induction Ventilation: Mask ventilation without difficulty Laryngoscope Size: Mac and 3 Grade View: Grade I Tube type: Oral Tube size: 7.0 mm Number of attempts: 1 Airway Equipment and Method: Stylet Placement Confirmation: ETT inserted through vocal cords under direct vision,  positive ETCO2 and breath sounds checked- equal and bilateral Secured at: 21 cm Tube secured with: Tape Dental Injury: Teeth and Oropharynx as per pre-operative assessment

## 2017-12-02 LAB — SURGICAL PATHOLOGY

## 2017-12-06 DIAGNOSIS — K8020 Calculus of gallbladder without cholecystitis without obstruction: Secondary | ICD-10-CM

## 2017-12-12 ENCOUNTER — Ambulatory Visit: Payer: Self-pay | Admitting: Gastroenterology

## 2017-12-12 ENCOUNTER — Encounter: Payer: Self-pay | Admitting: Surgery

## 2017-12-12 ENCOUNTER — Ambulatory Visit (INDEPENDENT_AMBULATORY_CARE_PROVIDER_SITE_OTHER): Payer: Medicaid Other | Admitting: Surgery

## 2017-12-12 VITALS — BP 143/89 | HR 93 | Temp 97.9°F | Ht 63.0 in | Wt 152.4 lb

## 2017-12-12 DIAGNOSIS — K8020 Calculus of gallbladder without cholecystitis without obstruction: Secondary | ICD-10-CM

## 2017-12-12 NOTE — Progress Notes (Signed)
Outpatient postop visit  12/12/2017  [name] is an 42 y.o. female.    Procedure: Laparoscopic cholecystectomy  CC: Constipation  HPI: Vena interpreter we discussed the issue of constipation that she is experiencing.  She also requested narcotics for epigastric incisional pain.  She wanted a refill.  She has had a single episode of nausea this morning no emesis and was feeling well all along and improving every day.  She does lifting at work.  Medications reviewed.    Physical Exam:  BP (!) 143/89   Pulse 93   Temp 97.9 F (36.6 C) (Oral)   Ht 5\' 3"  (1.6 m)   Wt 152 lb 6.4 oz (69.1 kg)   BMI 27.00 kg/m     PE: No icterus no jaundice abdomen is soft nondistended nontympanitic nontender wounds are clean no erythema no drainage Dermabond in place.  The epigastric wound is the largest of the 4.    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  Interview was held in the presence of an interpreter. I discussed via the interpreter that refilling narcotics at the 2-week time point is not indicated and would make her constipation worse.  I suggested over-the-counter analgesics and especially Tylenol and that she utilize over-the-counter medication to assist with her bowel movements.  I suggested a couple of different ones and that she could ask the pharmacist for recommendations at the pharmacy.  She will follow-up on an as-needed basis. Lattie Hawichard E Bertrand Vowels, MD, FACS

## 2017-12-12 NOTE — Patient Instructions (Signed)

## 2018-01-07 NOTE — Interval H&P Note (Signed)
History and Physical Interval Note:  01/07/2018 7:40 AM  [name]  has presented today for surgery, with the diagnosis of SYMPTOMATIC CHOLELITHIASIS  The various methods of treatment have been discussed with the patient and family. After consideration of risks, benefits and other options for treatment, the patient has consented to  Procedure(s): LAPAROSCOPIC CHOLECYSTECTOMY (N/A) as a surgical intervention .  The patient's history has been reviewed, patient examined, no change in status, stable for surgery.  I have reviewed the patient's chart and labs.  Questions were answered to the patient's satisfaction.     Ancil Linsey

## 2018-01-24 ENCOUNTER — Ambulatory Visit: Payer: Self-pay | Admitting: Gastroenterology

## 2018-01-31 ENCOUNTER — Other Ambulatory Visit: Payer: Self-pay

## 2018-01-31 ENCOUNTER — Encounter: Payer: Self-pay | Admitting: Gastroenterology

## 2018-01-31 ENCOUNTER — Ambulatory Visit: Payer: Medicaid Other | Admitting: Gastroenterology

## 2018-01-31 VITALS — BP 107/71 | HR 79 | Temp 97.5°F | Ht 63.0 in | Wt 157.0 lb

## 2018-01-31 DIAGNOSIS — K219 Gastro-esophageal reflux disease without esophagitis: Secondary | ICD-10-CM

## 2018-01-31 MED ORDER — RANITIDINE HCL 75 MG PO TABS: 75.0000 mg | ORAL_TABLET | Freq: Two times a day (BID) | ORAL | 2 refills | Status: DC

## 2018-01-31 NOTE — Patient Instructions (Addendum)
F/U 3 months  Purchase a bedwedge  B?nh tro ng??c d? dy th?c qu?n, Ng??i l?n Gastroesophageal Reflux Disease, Adult Thng th??ng, th?c ?n di chuy?n xu?ng th?c qu?n v ? trong d? dy ?? tiu ha. Tuy nhin, khi m?t ng??i b? b?nh tro ng??c d? dy th?c qu?n (GERD), th?c ?n v a xt trong d? dy tro ng??c tr? l?i th?c qu?n. Khi tnh tr?ng ny x?y ra, th?c qu?n b? lot v vim. Theo th?i gian, GERD c th? t?o ra nh?ng l? nh? (v?t lot) trn l?p nim m?c th?c qu?n. Nguyn nhn g gy ra? Tnh tr?ng ny do m?t v?n ?? c?a ph?n c? gi?a th?c qu?n v d? dy (c? th?t th?c qu?n d??i, hay LES) gy ra. Thng th??ng c? LES ?ng l?i sau khi th?c ?n ?i qua th?c qu?n vo d? dy. Khi LES b? y?u ho?c b?t th??ng, c? khng ?ng theo ?ng cch v ?i?u ? cho php th?c ?n v a xt d? dy tro ng??c tr? l?i th?c qu?n. LES c th? b? y?u do m?t s? ch?t ?n [place] nh?t ??nh, thu?c v cc tnh tr?ng b?nh l, bao g?m:  S? d?ng thu?c l.  Mang thai.  Thot v? honh.  S? d?ng nhi?u r??u.  M?t s? lo?i th?c ?n v ?? u?ng nh?t ??nh, nh? c ph, s c la, hnh v b?c h.  ?i?u g lm t?ng nguy c?? Tnh tr?ng ny hay x?y ra h?n ?:  Nh?ng ng??i t?ng cn.  Nh?ng ng??i c cc b?nh ? m lin k?t.  Nh?ng ng??i s? d?ng thu?c NSAID.  Cc d?u hi?u ho?c tri?u ch?ng l g? Nh?ng tri?u ch?ng c?a tnh tr?ng ny bao g?m:  ? nng.  Kh nu?t ho?c ?au khi nu?t.  C?m th?y nh? c m?t kh?i c?c trong c? h?ng.  C?m gic ??ng trong mi?ng.  H?i th? hi.  C nhi?u n??c b?t.  C?m gic kh ch?u trong b?ng ho?c ch??ng b?ng.  ? h?i.  ?au ng?c.  Kh th? ho?c th? kh kh.  Ho lin t?c (m?n tnh) ho?c ho vo ban ?m.  B? h?ng l?p men r?ng.  S?t cn.  Nh?ng tnh tr?ng khc nhau c th? gy ?au ng?c. B?o ??m ph?i ??n khm chuyn gia ch?m [place] s?c kh?e n?u qu v? b? ?au ng?c.  Ch?n ?on tnh tr?ng ny nh? th? no? Chuyn gia ch?m [place] s?c kh?e c?a qu v? s? h?i v? b?nh s? v khm th?c th? cho qu v?. ?? xc ??nh qu v? b? GERD  nh? hay n?ng, chuyn gia ch?m [place] s?c kh?e c?ng c th? theo di qu v? ?p ?ng v?i vi?c ?i?u tr? nh? th? no. Qu v? c?ng c th? ph?i lm cc ki?m tra khc, bao g?m:  N?i soi ?? ki?m tra d? dy v th?c qu?n b?ng m?t camera nh?.  Ki?m tra ?o n?ng ?? a xt trong th?c qu?n c?a qu v?.  Ki?m tra ?o m?c p l?c ln th?c qu?n c?a qu v?.  Nu?t bari ho?c nu?t bari ?i?u ch?nh ?? hi?n th? hnh dng, kch th??c v ch?c n?ng c?a th?c qu?n c?a qu v?.  Tnh tr?ng ny ???c ?i?u tr? nh? th? no? M?c tiu c?a ?i?u tr? l gip gi?m cc tri?u ch?ng v ng?n ng?a bi?n ch?ng. Vi?c ?i?u tr? b?nh ny c th? khc nhau ty thu?c m?c ?? n?ng c?a tri?u ch?ng. Chuyn gia ch?m [place] s?c kh?e c?a qu v? c th? khuy?n ngh?:  Thay ??i ch? ?? ?  n.  Thu?c.  Ph?u thu?t.  Tun th? nh?ng h??ng d?n ny ? nh: Ch? ?? ?n u?ng  Tun th? m?t ch? ?? ?n theo khuy?n ngh? c?a chuyn gia ch?m [place] s?c kh?e. Vi?c ny c th? l trnh cc th?c ?n v ?? u?ng nh?: ? C ph v tr (c ho?c khng c caffeine). ? ?? u?ng c ch?ar??u. ? ?? u?ng t?ng l?c v ?? u?ng dng trong th? thao. ? ?? u?ng c ga ho?c soda. ? S c la v c ca. ? B?c h v h??ng v? b?c h. ? T?i v hnh. ? C?i ng?a (Horseradish). ? Cc th?c ?n nhi?u gia v? v a xt, bao g?m h?t tiu, b?t ?t, b?t ca ri, gi?m, n??c s?t cay v n??c s?t barbecue. ? N??c qu? ho?c qu? h? cam qut, ch?ng h?n nh? cam, chanh v chanh l cam. ? Cc th?c ?n c c chua, nh? n??c x?t ??, ?t, n??c x?t salsa v pizza km x?t ??. ? Th?c ?n chin v nhi?u ch?t bo, ch?ng h?n nh? bnh rn, khoai ty chin, khoai ty rn v n??c x?t nhi?u ch?t bo. ? Th?t nhi?u ch?t bo, ch?ng h?n nh? hot dog (bnh m k?p xc xch) v cc lo?i th?t ?? v tr?ng nhi?u m?, ch?ng h?n nh? th?t n?c l?ng, xc xch, gi?m bng v th?t l?n xng khi. ? Nh?ng s?n ph?m b? s?a giu ch?t bo, nh? s?a nguyn kem, b? v pho mt kem.  ?n cc b?a nh?, th??ng xuyn thay v cc b?a no.  Trnh u?ng nhi?u n??c khi qu v? ?n.  Trnh ?n  trong kho?ng 2-3 gi? tr??c khi ?i ng?.  Trnh n?m xu?ng ngay sau khi ?n.  Khngt?p th? d?c ngay sau khi ?n. H??ng d?n chung  Ch  ??n b?t c? thay ??i no v? tri?u ch?ng c?a qu v?.  Ch? s? d?ng thu?c khng k ??n v thu?c k ??n theo ch? d?n c?a chuyn gia ch?m [place] s?c kh?e. Khng dng aspirin, ibuprofen, ho?c cc thu?c NSAID khc tr? khi chuyn gia ch?m [place] s?c kh?e c?a qu v? [place] qu v? lm nh? v?y.  Khng s? d?ng b?t k? s?n ph?m thu?c l no, bao g?m thu?c l d?ng ht, thu?c l d?ng nhai v thu?c l ?i?n t?. N?u qu v? c?n gip ?? ?? cai thu?c, hy h?i chuyn gia ch?m [place] s?c kh?e.  M?c qu?n o r?ng. Khng m?c b?t c? th? g ch?t quanh eo m c th? gy p l?c ln b?ng.  Nng (nng cao) ??u gi??ng c?a qu v? thm 6 inch (15 cm).  C? g?ng gi?m c?ng th?ng, ch?ng h?n nh? t?p yoga ho?c thi?n. N?u qu v? c?n gip ?? ?? lm gi?m c?ng th?ng, hy h?i chuyn gia ch?m [place] s?c kh?e.  N?u qu v? th?a cn, hy gi?m cn n?ng v? m?c c l?i cho s?c kh?e c?a qu v?. Hy h?i chuyn gia ch?m [place] s?c kh?e ?? ???c h??ng d?n v? m?c tiu gi?m cn an ton.  Tun th? t?t c? cc l?n khm theo di theo ch? d?n c?a chuyn gia ch?m [place] s?c kh?e. ?i?u ny c vai tr quan tr?ng. Hy lin l?c v?i chuyn gia ch?m [place] s?c kh?e n?u:  Qu v? c cc tri?u ch?ng m?i.  Qu v? b? s?t cn khng r nguyn nhn.  Qu v? b? kh nu?t ho?c b? ?au khi nu?t.  Qu v? th? kh kh ho?c ho dai d?ng.  Cc tri?u ch?ng c?a qu  v? khng c?i thi?n sau khi ???c ?i?u tr?[place] v? b? kh?n gi?ng. Yu c?u tr? gip ngay l?p t?c n?u:  Qu v? b? ?au ? cnh tay, c?, hm, r?ng ho?c l?ng.  Qu v? th?y ?? m? hi, chng m?t ho?c chong vng.  Qu v? b? ?au ng?c ho?c kh th?.  Qu v? nn v ch?t nn ra gi?ng nh? mu ho?c b c ph.  Qu v? b? ng?t.  Phn c?a qu v? c mu ho?c mu ?en.  Qu v? khng th? nu?t, u?ng ho?c ?n. Thng tin ny khng nh?m m?c ?ch thay th? cho l?i khuyn m chuyn gia ch?m [place] s?c kh?e ni v?i qu v?. Hy  b?o ??m qu v? ph?i th?o lu?n b?t k? v?n ?? g m qu v? c v?i chuyn gia ch?m [place] s?c kh?e c?a qu v?.     Gi??ng g?p nm [place] gi??ng t?t h?n cho ng? ?? gip h? tr? tiu ha, H? th?ng h h?p v ?au qu?n l (297 gi) Vi?t bnh lu?n   N?u b?n ?ang ??u tranh v?i v?n ?? tiu ha ho?c ???ng h h?p v c?m gic kh ch?u - ???ng vi?n g?p gi??ng nm l hon h?o gi??ng nm g?i gi?i php dnh cho b?n! H? tr? b?t nm cho php c? th? ?? n?m trn m?t nghing d?n d?n trong khi h? tr? c?a b?n ??u, ng?c v l?ng tho?i mi. C?ng c s?n trong m?t chi?u di di, g?i nm ny l m?t trong wedges ??nh v? c l?i nh?t cho ng??i s? d?ng duy nh?t. L?i ch c?a nm gi??ng g?p  Nng cao c? th? c?a b?n ?? gip ki?m sot cc tri?u ch?ng tiu ha lin quan ??n tro ng??c, GERD ho?c ch?ng ? nng  Thc ??y cc dng kh thch h?p cho th? d? dng h?n v gip gi?m b?t p l?c xoang  H? tr? c?t s?ng c?a b?n ?? gip lm d?u c? b?p c?ng th?ng  N?p g?p cho vi?c l?u tr? thu?n ti?n (d??i quy?n h?u h?t gi??ng!)  Bao g?m cc [place] zippered ba m?m, Nng thn trn c?a b?n trong khi ng? cho tiu ha t?t h?n, t v?n ??! Chng ti g?p [place] nm thm m?t nghing cho gi??ng c?a b?n tho?i mi h? tr? ??u v ng?c c?a b?n ? m?t v? tr th?ng ??ng, h?i gc c?nh. R?t d? dng cho php ng??i s? d?ng duy nh?t ?? nng cao c?a h? trn c? th? trong khi ng?, m?t v? tr ???c ?? ngh? ?? gi?m tri?u ch?ng tro ng??c axit, GERD, ch?ng ? nng v tnh tr?ng tiu ha khc. Ny ??m gi??ng nm c?ng gip b?n ng? tho?i mi. Thi?t k? g?p cho php l?u tr? nh? g?n. ?i km v?i m?t ba zippered [place] l d? dng tho r?i cho my gi?t. B? sung bao g?m c?ng c s?n. Provides d?n mi? t - gip tiu ha vi?n tr? trong gi?c ng? - hon h?o cho GERD, tro ng??c axit & ch?ng ? nng  Gip gi?m b?t p l?c h?i th? v sinus  Cc n?p g?p trong n?a cho l?u tr? nhanh, nh? g?n  M?i ??m nm l 24" di (lu h?n 32" c s?n c?ng)  C s?n trong 7 inch, 10 inch v 12 inch heights - chng  ti ?? ngh? 7" chi?u cao cho h?u h?t ng??i dng.  Bao g?m zippered Hydrologist - tho l?p v my c th? Gi?t  Bao g?m 1 n?m "???ng vi?n ch?m [place]"b?o  hnh - ch? khi b?n mua t? [email or web address]   Gastroesophageal Reflux Disease, Adult Normally, food travels down the esophagus and stays in the stomach to be digested. If a person has gastroesophageal reflux disease (GERD), food and stomach acid move back up into the esophagus. When this happens, the esophagus becomes sore and swollen (inflamed). Over time, GERD can make small holes (ulcers) in the lining of the esophagus. Follow these instructions at home: Diet  Follow a diet as told by your doctor. You may need to avoid foods and drinks such as: ? Coffee and tea (with or without caffeine). ? Drinks that contain alcohol. ? Energy drinks and sports drinks. ? Carbonated drinks or sodas. ? Chocolate and cocoa. ? Peppermint and mint flavorings. ? Garlic and onions. ? Horseradish. ? Spicy and acidic foods, such as peppers, chili powder, curry powder, vinegar, hot sauces, and BBQ sauce. ? Citrus fruit juices and citrus fruits, such as oranges, lemons, and limes. ? Tomato-based foods, such as red sauce, chili, salsa, and pizza with red sauce. ? Fried and fatty foods, such as donuts, french fries, potato chips, and high-fat dressings. ? High-fat meats, such as hot dogs, rib eye steak, sausage, ham, and bacon. ? High-fat dairy items, such as whole milk, butter, and cream cheese.  Eat small meals often. Avoid eating large meals.  Avoid drinking large amounts of liquid with your meals.  Avoid eating meals during the 2-3 hours before bedtime.  Avoid lying down right after you eat.  Do not exercise right after you eat. General instructions  Pay attention to any changes in your symptoms.  Take over-the-counter and prescription medicines only as told by your doctor. Do not take aspirin, ibuprofen, or other NSAIDs unless your doctor says it is  okay.  Do not use any tobacco products, including cigarettes, chewing tobacco, and e-cigarettes. If you need help quitting, ask your doctor.  Wear loose clothes. Do not wear anything tight around your waist.  Raise (elevate) the head of your bed about 6 inches (15 cm).  Try to lower your stress. If you need help doing this, ask your doctor.  If you are overweight, lose an amount of weight that is healthy for you. Ask your doctor about a safe weight loss goal.  Keep all follow-up visits as told by your doctor. This is important. Contact a doctor if:  You have new symptoms.  You lose weight and you do not know why it is happening.  You have trouble swallowing, or it hurts to swallow.  You have wheezing or a cough that keeps happening.  Your symptoms do not get better with treatment.  You have a hoarse voice. Get help right away if:  You have pain in your arms, neck, jaw, teeth, or back.  You feel sweaty, dizzy, or light-headed.  You have chest pain or shortness of breath.  You throw up (vomit) and your throw up looks like blood or coffee grounds.  You pass out (faint).  Your poop (stool) is bloody or black.  You cannot swallow, drink, or eat. This information is not intended to replace advice given to you by your health care provider. Make sure you discuss any questions you have with your health care provider.  Folding Bed Wedge Cushion The Better Bed Wedge for Sleeping To Help Aid In Digestion, Respiratory Systems and Pain Management  Raise Your Upper Body While Sleeping for Better Digestion, Fewer Problems! Our Folding Wedge Cushion adds an incline for your bed to  comfortably support your head and chest in an upright, slightly angled position. Very easily allows single users to raise their upper body during sleep, a position recommended for reducing symptoms from acid reflux, GERD, heartburn and other digestive conditions. This bed wedge cushion also helps you sleep  comfortably. The folding design allows for compact storage. Comes with a zippered velour cover which is easily removable for machine washing. Additional covers are also available. Provides gradual support - Helps aid digestion during sleep - perfect for GERD, acid reflux & heartburn  Helps ease breathing and sinus pressure  Folds in half for fast, compact storage  Each wedge cushion is 24" long (longer 32" are also available)  Available in 7 inch, 10 inch and 12 inch heights - we recommend the 7" height for most users.  Includes zippered velour cover - removable and machine washable  Includes a 1 year "Contour care"warranty - Only when you purchase from [email or web address]!

## 2018-01-31 NOTE — Progress Notes (Signed)
Megan Knight [address]  [address]  [place], [place] [id number]  Main: [phone]  Fax: [phone]   Gastroenterology Consultation  Referring Provider:     Ancil Linseyavis, Jason Evan, MD Primary Care Physician:  Department, [facility] Primary Gastroenterologist:  Dr. Melodie BouillonVarnita Brazen Domangue Reason for Consultation:     Indigestion        HPI:    Chief Complaint  Patient presents with  . Establish Care    Referral Dr. Satira MccallumJason Davis, MD for epigastric pain    Megan Claretha Cooperga T Wence is a 42 y.o. y/o female referred for consultation & management  by Dr. Earlene Plateravis.  Patient reports 2 to 3977-month history of indigestion, heartburn, with no dysphagia.  This occurs intermittently about once a week.  Only occurs when she eats fatty or spicy foods.  Does not occur if she avoids these foods.  She was given omeprazole by Dr. Earlene Plateravis, and took it for a month prior to her cholecystectomy that was done in April 2019 for symptomatically cholelithiasis, and states it did not help her symptoms.  Does not take any antacids over-the-counter.  Is not taking omeprazole at this time.  No weight loss, no abdominal pain, no altered bowel habits.  No blood in stool.  No family history of malignancy. No previous EGD or colonoscopies.  Past Medical History:  Diagnosis Date  . Medical history non-contributory     Past Surgical History:  Procedure Laterality Date  . BREAST SURGERY Bilateral    breast augmentation  . CESAREAN SECTION    . CHOLECYSTECTOMY N/A 11/30/2017   Procedure: LAPAROSCOPIC CHOLECYSTECTOMY;  Surgeon: Ancil Linseyavis, Jason Evan, MD;  Location: [facility] ORS;  Service: General;  Laterality: N/A;    Prior to Admission medications   Medication Sig Start Date End Date Taking? Authorizing Provider  ibuprofen (ADVIL,MOTRIN) 600 MG tablet Take 1 tablet (600 mg total) by mouth every 8 (eight) hours as needed. Patient not taking: Reported on 11/23/2017 07/30/17   Merrily Brittleifenbark, Neil, MD  omeprazole (PRILOSEC) 20  MG capsule Take 1 capsule (20 mg total) by mouth daily. Patient not taking: Reported on 11/23/2017 10/13/17   Ancil Linseyavis, Jason Evan, MD  ondansetron (ZOFRAN) 4 MG tablet Take 1 tablet (4 mg total) by mouth daily as needed for nausea or vomiting. Patient not taking: Reported on 11/23/2017 07/30/17 07/30/18  Merrily Brittleifenbark, Neil, MD    Family History  Problem Relation Age of Onset  . Cancer - Colon Neg Hx      Social History   Tobacco Use  . Smoking status: Never Smoker  . Smokeless tobacco: Never Used  Substance Use Topics  . Alcohol use: Yes    Comment: rarely  . Drug use: No    Allergies as of 01/31/2018  . (No Known Allergies)    Review of Systems:    All systems reviewed and negative except where noted in HPI.   Physical Exam:  BP 107/71   Pulse 79   Temp (!) 97.5 F (36.4 C) (Oral)   Ht 5\' 3"  (1.6 m)   Wt 157 lb (71.2 kg)   BMI 27.81 kg/m  No LMP recorded. Patient has had an implant. Psych:  Alert and cooperative. Normal mood and affect. General:   Alert,  Well-developed, well-nourished, pleasant and cooperative in NAD Head:  Normocephalic and atraumatic. Eyes:  Sclera clear, no icterus.   Conjunctiva pink. Ears:  Normal auditory acuity. Nose:  No deformity, discharge, or lesions. Mouth:  No deformity or lesions,oropharynx pink &  moist. Neck:  Supple; no masses or thyromegaly. Lungs:  Respirations even and unlabored.  Clear throughout to auscultation.   No wheezes, crackles, or rhonchi. No acute distress. Heart:  Regular rate and rhythm; no murmurs, clicks, rubs, or gallops. Abdomen:  Normal bowel sounds.  No bruits.  Soft, non-tender and non-distended without masses, hepatosplenomegaly or hernias noted.  No guarding or rebound tenderness.    Msk:  Symmetrical without gross deformities. Good, equal movement & strength bilaterally. Pulses:  Normal pulses noted. Extremities:  No clubbing or edema.  No cyanosis. Neurologic:  Alert and oriented x3;  grossly normal  neurologically. Skin:  Intact without significant lesions or rashes. No jaundice. Lymph Nodes:  No significant cervical adenopathy. Psych:  Alert and cooperative. Normal mood and affect.   Labs: CBC    Component Value Date/Time   WBC 8.2 11/30/2017 0821   RBC 4.95 11/30/2017 0821   HGB 14.9 11/30/2017 0821   HCT 44.8 11/30/2017 0821   PLT 265 11/30/2017 0821   MCV 90.6 11/30/2017 0821   MCH 30.0 11/30/2017 0821   MCHC 33.1 11/30/2017 0821   RDW 13.2 11/30/2017 0821   LYMPHSABS 3.2 11/30/2017 0821   MONOABS 0.7 11/30/2017 0821   EOSABS 1.3 (H) 11/30/2017 0821   BASOSABS 0.0 11/30/2017 0821   CMP     Component Value Date/Time   NA 141 11/30/2017 0821   K 4.0 11/30/2017 0821   CL 108 11/30/2017 0821   CO2 26 11/30/2017 0821   GLUCOSE 108 (H) 11/30/2017 0821   BUN 15 11/30/2017 0821   CREATININE 0.67 11/30/2017 0821   CALCIUM 9.0 11/30/2017 0821   PROT 7.9 11/30/2017 0821   ALBUMIN 4.0 11/30/2017 0821   AST 18 11/30/2017 0821   ALT 15 11/30/2017 0821   ALKPHOS 59 11/30/2017 0821   BILITOT 0.4 11/30/2017 0821   GFRNONAA >60 11/30/2017 0821   GFRAA >60 11/30/2017 1610    Imaging Studies: No results found.  Assessment and Plan:   Megan MAZIAH SMOLA is a 42 y.o. y/o female has been referred for indigestion and heartburn, once a week, that occurs more after dinnertime, and occurs after exacerbating foods only  Symptoms are consistent with acid reflux No alarm symptoms present Symptoms do not occur with non-exacerbating foods Patient educated extensively on acid reflux lifestyle modification, including buying a bed wedge, not eating 3 hrs before bedtime, diet modifications, and handout given for the same.   Patient states she eats late, just prior to bedtime, due to her work schedule, and is unable to change this Since her symptoms do not allow to her to eat foods that she wants, and she states sometimes even foods like banana causes heartburn, and are interfering with her  lifestyle, medical treatment is indicated  We will start with Zantac 75 mg twice daily If symptoms do not improve, I have asked her to call us at which time, change in medical therapy to PPI can be considered.  If this does not help, EGD can be considered at that time no alarm symptoms present to indicate urgent EGD at this time  Dr Megan Bouillon

## 2018-03-06 ENCOUNTER — Ambulatory Visit (INDEPENDENT_AMBULATORY_CARE_PROVIDER_SITE_OTHER): Payer: Medicaid Other | Admitting: Nurse Practitioner

## 2018-03-06 ENCOUNTER — Other Ambulatory Visit: Payer: Self-pay

## 2018-03-06 ENCOUNTER — Encounter: Payer: Self-pay | Admitting: Nurse Practitioner

## 2018-03-06 VITALS — BP 117/70 | HR 101 | Temp 98.5°F | Ht 61.0 in | Wt 156.0 lb

## 2018-03-06 DIAGNOSIS — G5602 Carpal tunnel syndrome, left upper limb: Secondary | ICD-10-CM

## 2018-03-06 DIAGNOSIS — Z7689 Persons encountering health services in other specified circumstances: Secondary | ICD-10-CM | POA: Diagnosis not present

## 2018-03-06 DIAGNOSIS — M79609 Pain in unspecified limb: Secondary | ICD-10-CM | POA: Diagnosis not present

## 2018-03-06 MED ORDER — PREDNISONE 10 MG PO TABS: ORAL_TABLET | ORAL | 0 refills | Status: DC

## 2018-03-06 NOTE — Progress Notes (Signed)
Subjective:    Patient ID: Megan Knight, female    DOB: April 12, 1976, 42 y.o.   MRN: [id number]  Megan Knight is a 42 y.o. female presenting on 03/06/2018 for Establish Care (bilateral numbness in the hands x 1 yr. Pt states when she uses the nail drill or hold the phone her fingers go numb. ); Knee Pain (bilateral knee pain in the back of the knee that hurts after prolong sitting or lying.); and Urinary Incontinence (coughing, sneezing, and laughing )   HPI Establish Care New Provider Pt last seen by [facility] department about 2-3 years ago.  No recent PCP.  Numbness in hands: Left hand has more numbness than Right hand. Patient is left-handed.  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  She noted numbness first after pregnancy.  It has not worsened or improved since that time. Left hand has numbness most often when using a nail drill or holding her phone. She only notes R hand numbness and weakness when changing baby's diaper and when lifting objects with R hand.  Knee Pain: Back of knee hurts after prolonged sitting.  Patient reports it is very difficult to walk immediately after standing because of sharp pain in tendons behind her knee.    Urinary incontinence: Since pregnancy, patient has incontinence with coughing, sneezing, and laughing.  She often tries to avoid laughing to prevent incontinent episodes.  Leakage is usually small, but is inconvenient for patient.   Past Medical History:  Diagnosis Date  . Medical history non-contributory    Past Surgical History:  Procedure Laterality Date  . BREAST SURGERY Bilateral    breast augmentation  . CESAREAN SECTION    . CHOLECYSTECTOMY N/A 11/30/2017   Procedure: LAPAROSCOPIC CHOLECYSTECTOMY;  Surgeon: Ancil Linsey, MD;  Location: [facility] ORS;  Service: General;  Laterality: N/A;  . COSMETIC SURGERY  2006   Social History   Socioeconomic History  . Marital status: Divorced      Spouse name: Not on file  . Number of children: Not on file  . Years of education: Not on file  . Highest education level: Not on file  Occupational History  . Not on file  Social Needs  . Financial resource strain: Not on file  . Food insecurity:    Worry: Not on file    Inability: Not on file  . Transportation needs:    Medical: Not on file    Non-medical: Not on file  Tobacco Use  . Smoking status: Never Smoker  . Smokeless tobacco: Never Used  Substance and Sexual Activity  . Alcohol use: Yes    Comment: rarely < 2 per occasion, less than monthly consumption of alcohol  . Drug use: No  . Sexual activity: Yes    Birth control/protection: Implant    Comment: mutually monogamous relationship  Lifestyle  . Physical activity:    Days per week: Not on file    Minutes per session: Not on file  . Stress: Not on file  Relationships  . Social connections:    Talks on phone: Not on file    Gets together: Not on file    Attends religious service: Not on file    Active member of club or organization: Not on file    Attends meetings of clubs or organizations: Not on file    Relationship status: Not on file  . Intimate partner violence:    Fear  of current or ex partner: Not on file    Emotionally abused: Not on file    Physically abused: Not on file    Forced sexual activity: Not on file  Other Topics Concern  . Not on file  Social History Narrative  . Not on file   Family History  Problem Relation Age of Onset  . Cancer - Colon Neg Hx    No current outpatient medications on file prior to visit.   No current facility-administered medications on file prior to visit.     Review of Systems  Constitutional: Negative for chills and fever.  HENT: Negative for congestion and sore throat.   Eyes: Negative for pain.  Respiratory: Negative for cough, shortness of breath and wheezing.   Cardiovascular: Negative for chest pain, palpitations and leg swelling.   Gastrointestinal: Negative for abdominal pain, blood in stool, constipation, diarrhea, nausea and vomiting.  Endocrine: Negative for polydipsia.  Genitourinary: Positive for difficulty urinating (urinary incontinence). Negative for dysuria, frequency, hematuria and urgency.  Musculoskeletal: Positive for arthralgias (bilateral wrists, R knee). Negative for back pain, myalgias and neck pain.  Skin: Negative.  Negative for rash.  Allergic/Immunologic: Negative for environmental allergies.  Neurological: Negative for dizziness, weakness and headaches.  Hematological: Does not bruise/bleed easily.  Psychiatric/Behavioral: Negative for dysphoric mood and suicidal ideas. The patient is not nervous/anxious.    Per HPI unless specifically indicated above     Objective:    BP 117/70 (BP Location: Right Arm, Patient Position: Sitting, Cuff Size: Normal)   Pulse (!) 101   Temp 98.5 F (36.9 C) (Oral)   Ht 5\' 1"  (1.549 m)   Wt 156 lb (70.8 kg)   SpO2 100%   BMI 29.48 kg/m   Wt Readings from Last 3 Encounters:  03/06/18 156 lb (70.8 kg)  01/31/18 157 lb (71.2 kg)  12/12/17 152 lb 6.4 oz (69.1 kg)    Physical Exam  Constitutional: She is oriented to person, place, and time. She appears well-developed and well-nourished. No distress.  HENT:  Head: Normocephalic and atraumatic.  Cardiovascular: Normal rate, regular rhythm, S1 normal, S2 normal, normal heart sounds and intact distal pulses.  Pulmonary/Chest: Effort normal and breath sounds normal. No respiratory distress.  Musculoskeletal:       Right hip: Normal.       Left hip: Normal.       Right knee: Normal. She exhibits no swelling, no effusion and no ecchymosis.       Left knee: Normal.       Right ankle: Normal.       Left ankle: Normal.       Right upper leg: She exhibits tenderness (popliteal region tendons mildly TTP).       Left upper leg: Normal.       Right lower leg: Normal.       Left lower leg: Normal.  LEFT  Hand/Wrist Inspection: Normal appearance, symmetrical, no bulky MCP joints, no edema or erythema. Palpation: Non tender hand / wrist, carpal bones, including MCP, base of thumb. No distinct anatomical snuff box or scaphoid tenderness. Mild tenderness over APL / EPB tendons radially. ROM: full active wrist ROM flex / ext, ulnar / radial deviation, no pain with radial deviation Special Testing: Finkelstein's test negative. Positive Tinel's median nerve test Strength: 5/5 grip, thumb opposition, wrist flex/ext Neurovascular: distally intact   Neurological: She is alert and oriented to person, place, and time.  Skin: Skin is warm and dry. Capillary refill takes  less than 2 seconds.  Psychiatric: She has a normal mood and affect. Her behavior is normal. Judgment and thought content normal.  Vitals reviewed.   Results for orders placed or performed during the hospital encounter of 11/30/17  CBC WITH DIFFERENTIAL  Result Value Ref Range   WBC 8.2 3.6 - 11.0 K/uL   RBC 4.95 3.80 - 5.20 MIL/uL   Hemoglobin 14.9 12.0 - 16.0 g/dL   HCT 29.5 62.1 - 30.8 %   MCV 90.6 80.0 - 100.0 fL   MCH 30.0 26.0 - 34.0 pg   MCHC 33.1 32.0 - 36.0 g/dL   RDW 65.7 84.6 - 96.2 %   Platelets 265 150 - 440 K/uL   Neutrophils Relative % 36 %   Lymphocytes Relative 40 %   Monocytes Relative 8 %   Eosinophils Relative 16 %   Basophils Relative 0 %   Band Neutrophils 0 %   Metamyelocytes Relative 0 %   Myelocytes 0 %   Promyelocytes Absolute 0 %   Blasts 0 %   nRBC 0 0 /100 WBC   Other 0 %   Neutro Abs 3.0 1.4 - 6.5 K/uL   Lymphs Abs 3.2 1.0 - 3.6 K/uL   Monocytes Absolute 0.7 0.2 - 0.9 K/uL   Eosinophils Absolute 1.3 (H) 0 - 0.7 K/uL   Basophils Absolute 0.0 0 - 0.1 K/uL   Smear Review MORPHOLOGY UNREMARKABLE   Comprehensive metabolic panel  Result Value Ref Range   Sodium 141 135 - 145 mmol/L   Potassium 4.0 3.5 - 5.1 mmol/L   Chloride 108 101 - 111 mmol/L   CO2 26 22 - 32 mmol/L   Glucose, Bld 108 (H)  65 - 99 mg/dL   BUN 15 6 - 20 mg/dL   Creatinine, Ser 9.52 0.44 - 1.00 mg/dL   Calcium 9.0 8.9 - 84.1 mg/dL   Total Protein 7.9 6.5 - 8.1 g/dL   Albumin 4.0 3.5 - 5.0 g/dL   AST 18 15 - 41 U/L   ALT 15 14 - 54 U/L   Alkaline Phosphatase 59 38 - 126 U/L   Total Bilirubin 0.4 0.3 - 1.2 mg/dL   GFR calc non Af Amer >60 >60 mL/min   GFR calc Af Amer >60 >60 mL/min   Anion gap 7 5 - 15  Pregnancy, urine POC  Result Value Ref Range   Preg Test, Ur NEGATIVE NEGATIVE  Surgical pathology  Result Value Ref Range   SURGICAL PATHOLOGY      Surgical Pathology CASE: [id number] PATIENT: Megan NGA [facility] Surgical Pathology Report     SPECIMEN SUBMITTED: A. Gallbladder  CLINICAL HISTORY: None provided  PRE-OPERATIVE DIAGNOSIS: Symptomatic Cholelithiasis  POST-OPERATIVE DIAGNOSIS: Same as pre op     DIAGNOSIS: A. GALLBLADDER; CHOLECYSTECTOMY: - CHOLESTEROLOSIS AND CHOLELITHIASIS.   GROSS DESCRIPTION:  A. Labeled: gallbladder  Size of specimen: 8.2 x 2.5 x 2.3 cm  Previously opened: no  External surface: wrinkled purple  Wall thickness: 0.2 cm  Mucosa: velvety red with diffuse yellow stippling  Stones present: multiple friable yellow bosselated, aggregate 2.0 x 1.5 x 0.5 cm  Other findings: duct margin inked blue  Block summary: 1 - representative section and en face cystic duct margin    Final Diagnosis performed by Glenice Bow, MD.  Electronically signed 12/02/2017 1:26:21PM    The electronic signature indicates that the named Attending Pathologist has evaluated the specim en  Technical component performed at [facility], [address] [place], [place] [address] Lab: [phone]  Dir: Jolene Schimke, MD, MMM  Professional component performed at [facility], [facility], [address] [place], [place], [place] [address] Lab: [phone] Dir: Georgiann Cocker. Oneita Kras, MD        Assessment & Plan:   Problem List Items Addressed This Visit    None     Visit Diagnoses    Encounter to establish care   Previous PCP was at [facility] > 3 years ago.  Acute care received at [facility] and with Dr. Earlene Plater at [facility].  Records will not be requested.  Past medical, family, and surgical history reviewed w/ pt.     Carpal tunnel syndrome of left wrist    -  Primary Clinically most consistent with gradual worsening L hand carpal tunnel syndrome. History and exam not suggestive of other nerve impingement or complication - Suspected secondary to chronic repetitive overuse (L-handed) - Inadequate conservative therapy to date.  - Initial onset after pregnancy, so cannot exclude De Quervain's Tenosynovitis  Plan: 1. Discussed course of carpal tunnel syndrome, treatment, prognosis, complications 2. Start prednisone taper over 7 days Day 1-2: 60 mg, Day 3: 50 mg, Day 4: 40 mg; Day 5: 30 mg; Day 6 20 mg; Day 7: 10 mg then stop. - Take Tylenol PRN breakthrough 3. Consider future use of wrist splint support (avoid flex/ext repetition) wear overnight to bed, may use during day with excessive or repetitive activities 4. Follow-up within 4-6 weeks if not improved consider splinting, physical therapy, or referral to Neurology for nerve conduction study   Relevant Medications   predniSONE (DELTASONE) 10 MG tablet       Popliteal pain     Patient with popliteal pain with full leg extension and little flexibility of hamstrings on exam.  Tendon painful to palpation with extension and flexion of knee.  Likely worsened by prolonged sitting and prolonged shortening of hamstrings with sedentary activity levels. - Consider physical therapy.  Discussed need for stretching and strengthening for improvement. - Take prednisone as above for inflammation - May use ibuprofen and acetaminophen after prednisone.      Meds ordered this encounter  Medications  . predniSONE (DELTASONE) 10 MG tablet    Sig: Day 1-2 take 6 pills. Day 3 take 5 pills then reduce  by 1 pill each day.    Dispense:  27 tablet    Refill:  0    Order Specific Question:   Supervising Provider    Answer:   Smitty Cords [[id number]]    Follow up plan: Return in about 2 months (around 05/07/2018) for annual physical.  Megan Mcardle, DNP, AGPCNP-BC Adult Gerontology Primary Care Nurse Practitioner [facility] 03/08/2018, 1:24 PM

## 2018-03-06 NOTE — Patient Instructions (Addendum)
[name],   Thank you for coming in to clinic today.  1. You have very tight or shortened back leg muscles.  This requires stretching and strengthening. Stretch your hamstrings.  2. You also have carpal tunnel in your left wrist.  3. Take prednisone taper 10 mg tablets Day 1 (Today): Take 6 pills at one time Day 2: Take 6 pills  Day 3: Take 5 pills Day 4: Take 4 pills Day 5: Take 3 pills Day 6: Take 2 pills Day 7: Take 1 pill then stop.  Please schedule a follow-up appointment with Wilhelmina McardleLauren Kaetlin Bullen, AGNP. Return in about 2 months (around 05/07/2018) for annual physical.  If you have any other questions or concerns, please feel free to call the clinic or send a message through MyChart. You may also schedule an earlier appointment if necessary.  You will receive a survey after today's visit either digitally by e-mail or paper by [employer] mail. Your experiences and feedback matter to us.  Please respond so we know how we are doing as we provide care for you.   Wilhelmina McardleLauren Kellen Dutch, DNP, AGNP-BC Adult Gerontology Nurse Practitioner [facility], [facility]

## 2018-03-08 ENCOUNTER — Encounter: Payer: Self-pay | Admitting: Nurse Practitioner

## 2018-03-08 NOTE — Addendum Note (Signed)
Addended by: Wilhelmina McardleKENNEDY, Jaye Polidori R on: 03/08/2018 01:24 PM   Modules accepted: Level of Service

## 2018-03-29 ENCOUNTER — Telehealth: Payer: Self-pay | Admitting: Nurse Practitioner

## 2018-03-29 NOTE — Telephone Encounter (Signed)
Pt  Stopped by requesting a refill on  Prednisone   [facility] Pt  Call back  # is   [phone]

## 2018-03-31 NOTE — Telephone Encounter (Signed)
We cannot repeat prednisone this frequently.  Patient should take naproxen sodium (Aleeve) 440 mg twice daily for two weeks.  No additional ibuprofen, Motrin, Advil, or aspirin.  She may also take Tylenol if she is having other pain.

## 2018-03-31 NOTE — Telephone Encounter (Signed)
Attempted to contact the patient, no questions or concerns.

## 2018-03-31 NOTE — Telephone Encounter (Signed)
The pt was notified. No questions or concerns. 

## 2018-04-20 ENCOUNTER — Telehealth: Payer: Self-pay | Admitting: Surgery

## 2018-04-20 NOTE — Telephone Encounter (Signed)
Patient is calling and complaining of stomach pain and back pain, pain level at a 6. Patient said its all on the right side. Please call patient and advise.

## 2018-04-20 NOTE — Telephone Encounter (Signed)
Called patient back and asked how she was feeling. She stated that she had been having epigastric and RUQ pain for a week but that it is mainly at night time. She denied having fever, nausea, constipation and diarrhea. I asked patient what makes the pain better, she stated that nothing makes it better. I asked her what made the pain worse, she stated that certain foods makes the pain worse. I also asked if she ever started taking the Zantac 75 MG as recommended by Dr. Alessandra Bevelsahilliani. She stated that she did take them for a month but did not have a prescription to get more. However, she stated that on the month that she was taking the Zantac, it did not help her at all. I then recommended for her to call Dr. Martyn Ehrichahilliani's office for more recommendations. In addition, if her pain got worse, then she should go to the ED to be evaluated. Patient understood and had no further questions.

## 2018-05-02 ENCOUNTER — Other Ambulatory Visit: Payer: Medicaid Other

## 2018-05-02 DIAGNOSIS — Z0000 Encounter for general adult medical examination without abnormal findings: Secondary | ICD-10-CM

## 2018-05-03 LAB — CBC WITH DIFFERENTIAL/PLATELET
Basophils Absolute: 42 cells/uL (ref 0–200)
Basophils Relative: 0.5 %
Eosinophils Absolute: 1142 cells/uL — ABNORMAL HIGH (ref 15–500)
Eosinophils Relative: 13.6 %
HCT: 45.2 % — ABNORMAL HIGH (ref 35.0–45.0)
Hemoglobin: 15.1 g/dL (ref 11.7–15.5)
Lymphs Abs: 3032 cells/uL (ref 850–3900)
MCH: 30.3 pg (ref 27.0–33.0)
MCHC: 33.4 g/dL (ref 32.0–36.0)
MCV: 90.8 fL (ref 80.0–100.0)
MPV: 9.9 fL (ref 7.5–12.5)
Monocytes Relative: 5.4 %
Neutro Abs: 3730 cells/uL (ref 1500–7800)
Neutrophils Relative %: 44.4 %
Platelets: 279 10*3/uL (ref 140–400)
RBC: 4.98 10*6/uL (ref 3.80–5.10)
RDW: 13 % (ref 11.0–15.0)
Total Lymphocyte: 36.1 %
WBC mixed population: 454 cells/uL (ref 200–950)
WBC: 8.4 10*3/uL (ref 3.8–10.8)

## 2018-05-03 LAB — COMPLETE METABOLIC PANEL WITH GFR
AG Ratio: 1.3 (calc) (ref 1.0–2.5)
ALT: 11 U/L (ref 6–29)
AST: 13 U/L (ref 10–30)
Albumin: 3.9 g/dL (ref 3.6–5.1)
Alkaline phosphatase (APISO): 50 U/L (ref 33–115)
BUN: 14 mg/dL (ref 7–25)
CO2: 29 mmol/L (ref 20–32)
Calcium: 9 mg/dL (ref 8.6–10.2)
Chloride: 103 mmol/L (ref 98–110)
Creat: 0.78 mg/dL (ref 0.50–1.10)
GFR, Est African American: 109 mL/min/{1.73_m2} (ref >=60)
GFR, Est Non African American: 94 mL/min/{1.73_m2} (ref >=60)
Globulin: 3.1 g/dL (calc) (ref 1.9–3.7)
Glucose, Bld: 93 mg/dL (ref 65–99)
Potassium: 4.1 mmol/L (ref 3.5–5.3)
Sodium: 138 mmol/L (ref 135–146)
Total Bilirubin: 0.4 mg/dL (ref 0.2–1.2)
Total Protein: 7 g/dL (ref 6.1–8.1)

## 2018-05-03 LAB — LIPID PANEL
Cholesterol: 171 mg/dL (ref <200)
HDL: 40 mg/dL — ABNORMAL LOW (ref >50)
LDL Cholesterol (Calc): 95 mg/dL (calc)
Non-HDL Cholesterol (Calc): 131 mg/dL (calc) — ABNORMAL HIGH (ref <130)
Total CHOL/HDL Ratio: 4.3 (calc) (ref <5.0)
Triglycerides: 250 mg/dL — ABNORMAL HIGH (ref <150)

## 2018-05-03 LAB — TSH: TSH: 2.6 mIU/L

## 2018-05-03 LAB — HEMOGLOBIN A1C
Hgb A1c MFr Bld: 5.8 % of total Hgb — ABNORMAL HIGH (ref <5.7)
Mean Plasma Glucose: 120 (calc)
eAG (mmol/L): 6.6 (calc)

## 2018-05-08 ENCOUNTER — Other Ambulatory Visit: Payer: Self-pay

## 2018-05-08 ENCOUNTER — Ambulatory Visit (INDEPENDENT_AMBULATORY_CARE_PROVIDER_SITE_OTHER): Payer: Medicaid Other | Admitting: Nurse Practitioner

## 2018-05-08 ENCOUNTER — Other Ambulatory Visit (HOSPITAL_COMMUNITY)
Admission: RE | Admit: 2018-05-08 | Discharge: 2018-05-08 | Disposition: A | Discharge status: Home / Self Care | Payer: Medicaid Other | Source: Ambulatory Visit | Attending: Nurse Practitioner | Admitting: Nurse Practitioner

## 2018-05-08 ENCOUNTER — Encounter: Payer: Self-pay | Admitting: Nurse Practitioner

## 2018-05-08 ENCOUNTER — Encounter: Payer: Self-pay | Admitting: Gastroenterology

## 2018-05-08 ENCOUNTER — Ambulatory Visit (INDEPENDENT_AMBULATORY_CARE_PROVIDER_SITE_OTHER): Payer: Medicaid Other | Admitting: Gastroenterology

## 2018-05-08 VITALS — BP 123/76 | HR 80 | Ht 61.0 in | Wt 157.6 lb

## 2018-05-08 VITALS — BP 113/66 | HR 92 | Temp 98.4°F | Ht 61.0 in | Wt 157.6 lb

## 2018-05-08 DIAGNOSIS — Z0000 Encounter for general adult medical examination without abnormal findings: Secondary | ICD-10-CM

## 2018-05-08 DIAGNOSIS — R1084 Generalized abdominal pain: Secondary | ICD-10-CM | POA: Diagnosis not present

## 2018-05-08 DIAGNOSIS — M150 Primary generalized (osteo)arthritis: Secondary | ICD-10-CM | POA: Diagnosis not present

## 2018-05-08 DIAGNOSIS — Z124 Encounter for screening for malignant neoplasm of cervix: Secondary | ICD-10-CM | POA: Insufficient documentation

## 2018-05-08 DIAGNOSIS — M159 Polyosteoarthritis, unspecified: Secondary | ICD-10-CM

## 2018-05-08 DIAGNOSIS — M8949 Other hypertrophic osteoarthropathy, multiple sites: Secondary | ICD-10-CM

## 2018-05-08 MED ORDER — OMEPRAZOLE 20 MG PO CPDR: 20.0000 mg | DELAYED_RELEASE_CAPSULE | Freq: Every day | ORAL | 3 refills | Status: DC

## 2018-05-08 MED ORDER — DICLOFENAC SODIUM 50 MG PO TBEC: 50.0000 mg | DELAYED_RELEASE_TABLET | Freq: Two times a day (BID) | ORAL | 2 refills | Status: DC | PRN

## 2018-05-08 NOTE — Progress Notes (Signed)
Subjective:    Patient ID: Megan Knight, female    DOB: 02/15/1976, 42 y.o.   MRN: [id number]  Megan Knight is a 42 y.o. female presenting on 05/08/2018 for Annual Exam   HPI Annual Physical Exam Patient has been feeling well.  They have no acute concerns today. Sleeps 6-7 hours per night interrupted and usually able to return to sleep.  HEALTH MAINTENANCE: Weight/BMI: stable Physical activity: generally sedentary Diet: high carbohydrate, frequent fried food Seatbelt: always Sunscreen: occasionally PAP: due.  No intercourse over last 1 year. Mammogram: address next year Optometry: not regularly Dentistry: not regularly  VACCINES: Tetanus: approx 2012? Check [facility] Influenza: recommended  Arthritis Knee pain, hand pain - improved after ibuprofen and prednisone at last visit.  Once medications stopped, pain returned.  Patient desires to take anti-inflammatory to keep pain controlled so she can remain active.  Past Medical History:  Diagnosis Date  . Medical history non-contributory    Past Surgical History:  Procedure Laterality Date  . BREAST SURGERY Bilateral    breast augmentation  . CESAREAN SECTION    . CHOLECYSTECTOMY N/A 11/30/2017   Procedure: LAPAROSCOPIC CHOLECYSTECTOMY;  Surgeon: Megan Linsey, MD;  Location: [facility] ORS;  Service: General;  Laterality: N/A;  . COSMETIC SURGERY  2006   Social History   Socioeconomic History  . Marital status: Divorced    Spouse name: Not on file  . Number of children: Not on file  . Years of education: Not on file  . Highest education level: Not on file  Occupational History  . Not on file  Social Needs  . Financial resource strain: Not on file  . Food insecurity:    Worry: Not on file    Inability: Not on file  . Transportation needs:    Medical: Not on file    Non-medical: Not on file  Tobacco Use  . Smoking status: Never Smoker  . Smokeless tobacco: Never Used  Substance and Sexual Activity  . Alcohol use:  Yes    Comment: rarely < 2 per occasion, less than monthly consumption of alcohol  . Drug use: No  . Sexual activity: Yes    Birth control/protection: Implant    Comment: mutually monogamous relationship  Lifestyle  . Physical activity:    Days per week: Not on file    Minutes per session: Not on file  . Stress: Not on file  Relationships  . Social connections:    Talks on phone: Not on file    Gets together: Not on file    Attends religious service: Not on file    Active member of club or organization: Not on file    Attends meetings of clubs or organizations: Not on file    Relationship status: Not on file  . Intimate partner violence:    Fear of current or ex partner: Not on file    Emotionally abused: Not on file    Physically abused: Not on file    Forced sexual activity: Not on file  Other Topics Concern  . Not on file  Social History Narrative  . Not on file   Family History  Problem Relation Age of Onset  . Cancer - Colon Neg Hx    No current outpatient medications on file prior to visit.   No current facility-administered medications on file prior to visit.     Review of Systems Per HPI unless specifically indicated above     Objective:  BP 113/66 (BP Location: Right Arm, Patient Position: Sitting, Cuff Size: Normal)   Pulse 92   Temp 98.4 F (36.9 C) (Oral)   Ht 5\' 1"  (1.549 m)   Wt 157 lb 9.6 oz (71.5 kg)   BMI 29.78 kg/m   Wt Readings from Last 3 Encounters:  05/08/18 157 lb 9.6 oz (71.5 kg)  03/06/18 156 lb (70.8 kg)  01/31/18 157 lb (71.2 kg)    Physical Exam  Constitutional: She is oriented to person, place, and time. She appears well-developed and well-nourished. No distress.  HENT:  Head: Normocephalic and atraumatic.  Right Ear: External ear normal.  Left Ear: External ear normal.  Nose: Nose normal.  Mouth/Throat: Oropharynx is clear and moist.  Eyes: Pupils are equal, round, and reactive to light. Conjunctivae are normal.  Neck:  Normal range of motion. Neck supple. No JVD present. No tracheal deviation present. No thyromegaly present.  Cardiovascular: Normal rate, regular rhythm, normal heart sounds and intact distal pulses. Exam reveals no gallop and no friction rub.  No murmur heard. Pulmonary/Chest: Effort normal and breath sounds normal. No respiratory distress.  Abdominal: Soft. Bowel sounds are normal. She exhibits no distension. There is no hepatosplenomegaly. There is no tenderness.  Genitourinary:  Genitourinary Comments: Breast - Normal exam w/ symmetric breasts, no mass, no nipple discharge, no skin changes or tenderness.    Normal external female genitalia without lesions or fusion. Vaginal canal without lesions. Cervix with friability. Physiologic discharge on exam. Bimanual exam without adnexal masses, enlarged uterus, or cervical motion tenderness.  Implant palpated R medial upper arm  Musculoskeletal: Normal range of motion.  Lymphadenopathy:    She has no cervical adenopathy.  Neurological: She is alert and oriented to person, place, and time. No cranial nerve deficit.  Skin: Skin is warm and dry. Capillary refill takes less than 2 seconds.  Psychiatric: She has a normal mood and affect. Her behavior is normal. Judgment and thought content normal.  Nursing note and vitals reviewed.  Results for orders placed or performed in visit on 05/02/18  Hemoglobin A1c  Result Value Ref Range   Hgb A1c MFr Bld 5.8 (H) <5.7 % of total Hgb   Mean Plasma Glucose 120 (calc)   eAG (mmol/L) 6.6 (calc)  COMPLETE METABOLIC PANEL WITH GFR  Result Value Ref Range   Glucose, Bld 93 65 - 99 mg/dL   BUN 14 7 - 25 mg/dL   Creat 7.00 1.74 - 9.44 mg/dL   GFR, Est Non African American 94 > OR = 60 mL/min/1.55m2   GFR, Est African American 109 > OR = 60 mL/min/1.14m2   BUN/Creatinine Ratio NOT APPLICABLE 6 - 22 (calc)   Sodium 138 135 - 146 mmol/L   Potassium 4.1 3.5 - 5.3 mmol/L   Chloride 103 98 - 110 mmol/L   CO2 29  20 - 32 mmol/L   Calcium 9.0 8.6 - 10.2 mg/dL   Total Protein 7.0 6.1 - 8.1 g/dL   Albumin 3.9 3.6 - 5.1 g/dL   Globulin 3.1 1.9 - 3.7 g/dL (calc)   AG Ratio 1.3 1.0 - 2.5 (calc)   Total Bilirubin 0.4 0.2 - 1.2 mg/dL   Alkaline phosphatase (APISO) 50 33 - 115 U/L   AST 13 10 - 30 U/L   ALT 11 6 - 29 U/L  CBC with Differential/Platelet  Result Value Ref Range   WBC 8.4 3.8 - 10.8 Thousand/uL   RBC 4.98 3.80 - 5.10 Million/uL   Hemoglobin 15.1 11.7 -  15.5 g/dL   HCT 91.4 (H) 78.2 - 95.6 %   MCV 90.8 80.0 - 100.0 fL   MCH 30.3 27.0 - 33.0 pg   MCHC 33.4 32.0 - 36.0 g/dL   RDW 21.3 08.6 - 57.8 %   Platelets 279 140 - 400 Thousand/uL   MPV 9.9 7.5 - 12.5 fL   Neutro Abs 3,730 1,500 - 7,800 cells/uL   Lymphs Abs 3,032 850 - 3,900 cells/uL   WBC mixed population 454 200 - 950 cells/uL   Eosinophils Absolute 1,142 (H) 15 - 500 cells/uL   Basophils Absolute 42 0 - 200 cells/uL   Neutrophils Relative % 44.4 %   Total Lymphocyte 36.1 %   Monocytes Relative 5.4 %   Eosinophils Relative 13.6 %   Basophils Relative 0.5 %  Lipid panel  Result Value Ref Range   Cholesterol 171 <200 mg/dL   HDL 40 (L) >46 mg/dL   Triglycerides 962 (H) <150 mg/dL   LDL Cholesterol (Calc) 95 mg/dL (calc)   Total CHOL/HDL Ratio 4.3 <5.0 (calc)   Non-HDL Cholesterol (Calc) 131 (H) <130 mg/dL (calc)  TSH  Result Value Ref Range   TSH 2.60 mIU/L      Assessment & Plan:   Problem List Items Addressed This Visit    None    Visit Diagnoses    Encounter for annual physical exam    -  Primary Physical exam with no new findings.  Well adult with acute concerns regarding arthritis (see below).  Plan: 1. Obtain health maintenance screenings as above according to age. - Labs drawn prior to visit and are reviewed today.  Prediabetes and dyslipidemia are focus of discussion.  - Increase physical activity to 30 minutes most days of the week.  - Eat healthy diet high in vegetables and fruits; low in refined  carbohydrates. Cut back fried foods and rice products significantly to improve labs. 2. Return 1 year for annual physical.    Cervical cancer screening     On exam, found friable cervix.  Add GC/Chlamydia, trichomoniasis to cytology.  No current high risk sexual activity.   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  Intermediate improvement after antiinflammatories at last visit.  Normal kidney function at this time.  Plan:  1. Treat with diclofenac and acetaminophen prn.  Discussed alternate dosing and max dosing. - Take diclofenac 50 mg one tablet twice daily prn for joint pains.  Discussed need to limit and skip doses to prevent GI complications.  Patient verbalizes understanding. 2. Apply heat and/or ice to affected area. 3. May also apply a muscle rub with lidocaine or lidocaine patch after heat or ice. 4. Follow up prn in 6 months if needing to continue diclofenac.    Relevant Medications   diclofenac (VOLTAREN) 50 MG EC tablet      Meds ordered this encounter  Medications  . diclofenac (VOLTAREN) 50 MG EC tablet    Sig: Take 1 tablet (50 mg total) by mouth 2 (two) times daily as needed for moderate pain (knees and hands).    Dispense:  60 tablet    Refill:  2    Order Specific Question:   Supervising Provider    Answer:   Smitty Cords [[id number]]     Follow up plan: Return in about 1  year (around 05/09/2019) for annual physical.  Wilhelmina Mcardle, DNP, AGPCNP-BC Adult Gerontology Primary Care Nurse Practitioner [facility] 05/08/2018, 9:10 AM

## 2018-05-08 NOTE — Progress Notes (Signed)
Melodie Bouillon, MD [address]  [address]  [place], [place] [address]  Main: [phone]  Fax: [phone]   Primary Care Physician: Galen Manila, NP  Primary Gastroenterologist:  Dr. Melodie Bouillon  Chief Complaint  Patient presents with  . Follow-up    indigestion and heartburn    HPI: Megan Knight is a 42 y.o. female here for follow-up of heartburn.  Patient last seen in June 2019, and was prescribed H2 RA at that time.  Patient reports symptoms ongoing despite the medication.  She is not on any PPI.  Reports burning sensation in chest especially if she eats spicy or fatty foods.  Occurring 1-2 times a day.  No dysphagia.  Also reports midepigastric and bilateral upper quadrant cramping abdominal pain and also occur when she eats fatty or spicy foods.  2/10 pain.  Intermittent, occurring daily with heartburn.  No altered bowel habits.  Reports one formed bowel movement a day without straining.  No blood in stool.  No prior EGD or colonoscopy.  Abdominal pain does not improve after bowel movement.  Current Outpatient Medications  Medication Sig Dispense Refill  . diclofenac (VOLTAREN) 50 MG EC tablet Take 1 tablet (50 mg total) by mouth 2 (two) times daily as needed for moderate pain (knees and hands). 60 tablet 2  . omeprazole (PRILOSEC) 20 MG capsule Take 1 capsule (20 mg total) by mouth daily. 30 capsule 3   No current facility-administered medications for this visit.     Allergies as of 05/08/2018  . (No Known Allergies)    ROS:  General: Negative for anorexia, weight loss, fever, chills, fatigue, weakness. ENT: Negative for hoarseness, difficulty swallowing , nasal congestion. CV: Negative for chest pain, angina, palpitations, dyspnea on exertion, peripheral edema.  Respiratory: Negative for dyspnea at rest, dyspnea on exertion, cough, sputum, wheezing.  GI: See history of present illness. GU:  Negative for dysuria, hematuria, urinary  incontinence, urinary frequency, nocturnal urination.  Endo: Negative for unusual weight change.    Physical Examination:   BP 123/76   Pulse 80   Ht 5\' 1"  (1.549 m)   Wt 157 lb 9.6 oz (71.5 kg)   BMI 29.78 kg/m   General: Well-nourished, well-developed in no acute distress.  Eyes: No icterus. Conjunctivae pink. Mouth: Oropharyngeal mucosa moist and pink , no lesions erythema or exudate. Neck: Supple, Trachea midline Abdomen: Bowel sounds are normal, nontender, nondistended, no hepatosplenomegaly or masses, no abdominal bruits or hernia , no rebound or guarding.   Extremities: No lower extremity edema. No clubbing or deformities. Neuro: Alert and oriented x 3.  Grossly intact. Skin: Warm and dry, no jaundice.   Psych: Alert and cooperative, normal mood and affect.   Labs: CMP     Component Value Date/Time   NA 138 05/02/2018 1034   K 4.1 05/02/2018 1034   CL 103 05/02/2018 1034   CO2 29 05/02/2018 1034   GLUCOSE 93 05/02/2018 1034   BUN 14 05/02/2018 1034   CREATININE 0.78 05/02/2018 1034   CALCIUM 9.0 05/02/2018 1034   PROT 7.0 05/02/2018 1034   ALBUMIN 4.0 11/30/2017 0821   AST 13 05/02/2018 1034   ALT 11 05/02/2018 1034   ALKPHOS 59 11/30/2017 0821   BILITOT 0.4 05/02/2018 1034   GFRNONAA 94 05/02/2018 1034   GFRAA 109 05/02/2018 1034   Lab Results  Component Value Date   WBC 8.4 05/02/2018   HGB 15.1 05/02/2018   HCT 45.2 (H) 05/02/2018  MCV 90.8 05/02/2018   PLT 279 05/02/2018    Imaging Studies: No results found.  Assessment and Plan:   Megan AEVAH SODERMAN is a 42 y.o. y/o female here for follow-up of heartburn  No relief with Zantac We will change to omeprazole 20 mg 30 minutes before breakfast daily Patient asked to call us in 2 to 3 weeks if symptoms are not better, as we will increase the omeprazole to twice daily at that time if needed She verbalized understanding  (Risks of PPI use were discussed with patient including bone loss, C. Diff  diarrhea, pneumonia, infections, CKD, electrolyte abnormalities.  If clinically possible based on symptoms, goal would be to maintain patient on the lowest dose possible, or discontinue the medication with institution of acid reflux lifestyle modifications over time. Pt. Verbalizes understanding and chooses to continue the medication.)  Entire conversation and above examination occurred in the front of medical interpreter.  Patient verbalized understanding of the above plan and recommendations and was in agreement.    Dr Melodie Bouillon

## 2018-05-08 NOTE — Patient Instructions (Addendum)
[name],   Thank you for coming in to clinic today.  Phng ng?a cholesterol cao Preventing High Cholesterol Cholesterol l m?t ch?t gi?ng ch?t bo d?ng sp m c? th? c?n v?i l??ng nh?Marchelle Gearing c?a quy? vi? ta?o ra t?t c? cholesterol m c? th? c?n. B? cholesterol cao (cholesterol mu cao) lm t?ng nguy c? b? b?nh tim v ??t qu?Marland Kitchen Cholesterol d? (d? th?a) trong th?c ph?m qu v? ?n, ch?ng h?n nh? ch?t bo ??ng v?t (ch?t bo bo ha) t? th?t v m?t s? s?n ph?m s?a. Th??ng c th? phng ng?a cholesterol cao b?ng cch thay ??i ch? ?? ?n v l?i s?ng. N?u qu v? ? c cholesterol cao, qu v? c th? ki?m sot n b?ng cch thay ??i ch? ?? ?n v l?i s?ng, c?ng nh? l dng thu?c. C th? th?c hi?n nh?ng thay ??i dinh d??ng no khc?  ?n t ch?t bo bo ha h?n. Cc th?c ph?m c ch?a ch?t bo bo ha bao g?m th?t ?? v m?t s? s?n ph?m s?a.  Trnh th?t ch? bi?n s?n, nh? l th?t xng khi v th?t ?n tr?a.  [place] cc ch?t bo chuy?n ha, chng ???c tm th?y trong b? th?c v?t v m?t s? lo?i bnh n??ng.  [place] cc th?c ph?m v ?? u?ng c thm ???ng.  ?n nhi?u tri cy, rau c? v ng? c?c nguyn cm h?n.  Ch?n cc ngu?n protein t?t cho s?c kh?e, ch?ng h?n nh? c, th?t gia c?m v qu? h?ch.  Ch?n cc ngu?n ch?t bo t?t cho s?c kh?e, ch?ng h?n nh?: ? Qu? h?ch. ? D?u th?c v?t, ??c bi?t l d?u  liu. ? C c ch?a ch?t bo t?t cho s?c kh?e (axit bo omega-3), ch?ng h?n nh? c thu ho?c c h?i. C th? th?c hi?n nh?ng thay ??i no v? l?i s?ng?  Gi?m cn n?u qu v? th?a cn. Gi?m 5-10 lb (2,3-4,5 kg) c th? gip phng ng?a ho?c ki?m sot cholesterol cao v gi?m nguy c? b? ti?u ???ng v huy?t p cao. Hy yu c?u chuyn gia ch?m [place] s?c kh?e gip qu v? l?p m?t k? ho?ch t?p th? d?c v ch? ?? ?n ?? gi?m cn an ton.  T?p th? d?c ??y ??. Th??c hi?n i?t nh?t 150 phu?t t?p th? du?c v?i c???ng ?? trung bi?nh m?i tu?n. ? Qu v? c th? lm ?i?u ny trong cc phin t?p ng?n, vi l?n m?i ngy ho?c c th? t?p nh?ng  phin t?p di h?n m?t vi l?n m?i tu?n. V d?: qu v? c th? ?i b? nhanh ho?c ??p xe trong 10 pht, 3 l?n m?i ngy, trong 5 ngy m?i tu?n.  Khng ht thu?c. N?u qu v? c?n gip ?? ?? cai thu?c, hy h?i chuyn gia ch?m [place] s?c kh?e.  Gi?i h?n l??ng r??u qu v? tiu th?. N?u qu v? u?ng r??u, hy gi?i h?n l??ng r??u qu v? u?ng ? m?c khng qu 1 ly m?i ngy v?i ph? n? khng mang thai v 2 ly m?i ngy v?i nam gi?i. M?t ly t??ng ???ng v?i 12 ao-x? bia, 5 ao-x? r??u vang, ho?c 1 ao-x? r??u m?nh. T?i sao nh?ng thay ??i ny l?i quan tr?ng? N?u qu v? c cholesterol cao, cc c?n ?ng (m?ng bm) c th? tch t? trn thnh c?a cc m?ch mu. Cc m?ng bm khi?n ??ng m?ch h?p v c?ng h?n, ?i?u ny c th? lm h?n ch? ho?c c?n tr? dng mu l?u thng v khi?n cc c?c mu ?ng hnh  thnh. ?i?u ny lm t?ng m?nh nguy c? b? nh?i mu c? tim v ??t qu?Marland Kitchen Thay ??i ch? ?? ?n v l?i s?ng c th? gi?m nguy c? qu v? b? nh?ng tnh tr?ng ?e d?a tnh m?ng ny. Ti c th? lm g ?? gi?m nguy c??  Qu?n l cc y?u t? nguy c? b? cholesterol cao c?a qu v?. Hy trao ??i v?i chuyn gia ch?m [place] s?c kh?e v? t?t c? cc y?u t? nguy c? v cch gi?m b?t nguy c? c?a qu v?.  Qu?n l cc tnh tr?ng khc m qu v? c, ch?ng h?n nh? b?nh ti?u ???ng ho?c cao huy?t p (t?ng huy?t p).  ??nh k? ki?m tra l??ng cholesterol c?a qu v?.  Tun th? t?t c? cc l?n khm theo di theo ch? d?n c?a chuyn gia ch?m [place] s?c kh?e. ?i?u ny c vai tr quan tr?ng. Tnh tr?ng ny ???c ?i?u tr? nh? th? no? [place] thay ??i ch? ?? ?n v l?i s?ng, chuyn gia ch?m [place] s?c kh?e c th? khuy?n ngh? dng cc thu?c gip h? cholesterol, ch?ng h?n nh? thu?c lm gi?m l??ng cholesterol ???c t?o ra ? gan qu v?. Qu v? c th? c?n dng thu?c n?u:  Thay ??i ch? ?? ?n v l?i s?ng khng ?? lm gi?m cholesterol.  Qu v? c cholesterol cao v cc y?u t? nguy c? khc ??i v?i b?nh tim ho?c ??t qu?Marland Kitchen  Ch? s? d?ng thu?c khng k ??n v thu?c k ??n theo ch? d?n c?a chuyn gia ch?m  [place] s?c kh?e. N?i ?? tm thm thng tin:  Hi?p h?i Tim m?ch [place]?: [email or web address].br  Vi?n Tim, Ph?i v Mu Qu?c gia: http://hood.com/ Tm t?t  Cholesterol cao lm t?ng nguy c? b? b?nh tim v ??t qu? c?a qu v?. B?ng cch duy tr cholesterol ? m?c th?p, qu v? c th? gi?m nguy c? b? nh?ng tnh tr?ng ny.  Thay ??i ch? ?? ?n v l?i s?ng l nh?ng b??c quan tr?ng nh?t trong vi?c phng ng?a cholesterol cao.  Hy lm vi?c v?i chuyn gia ch?m [place] s?c kh?e ?? qu?n l cc y?u t? nguy c? c?a qu v? v lm xt nghi?m mu th??ng xuyn. Thng tin ny khng nh?m m?c ?ch thay th? cho l?i khuyn m chuyn gia ch?m [place] s?c kh?e ni v?i qu v?. Hy b?o ??m qu v? ph?i th?o lu?n b?t k? v?n ?? g m qu v? c v?i chuyn gia ch?m  s?c kh?e c?a qu v?. Document Released: 12/02/2016 Document Revised: 12/02/2016 Elsevier Interactive Patient Education  2018 Elsevier Inc.   Please schedule a follow-up appointment with Wilhelmina Mcardle, AGNP. Return in about 1 year (around 05/09/2019) for annual physical.  If you have any other questions or concerns, please feel free to call the clinic or send a message through MyChart. You may also schedule an earlier appointment if necessary.  You will receive a survey after today's visit either digitally by e-mail or paper by [employer]. Your experiences and feedback matter to [place].  Please respond so we know how we are doing as we provide care for you.   Wilhelmina Mcardle, DNP, AGNP-BC Adult Gerontology Nurse Practitioner [facility]

## 2018-05-10 LAB — CYTOLOGY - PAP
Chlamydia: NEGATIVE
Diagnosis: NEGATIVE
HPV: NOT DETECTED
Neisseria Gonorrhea: NEGATIVE
Trichomonas: NEGATIVE

## 2018-05-10 LAB — H PYLORI, IGM, IGG, IGA AB
H PYLORI IGG: 0.29 {index_val} (ref 0.00–0.79)
H pylori, IgM Abs: <9 units (ref 0.0–8.9)
H. PYLORI, IGA ABS: 29.2 U — AB (ref 0.0–8.9)

## 2018-05-11 ENCOUNTER — Other Ambulatory Visit: Payer: Self-pay

## 2018-05-19 ENCOUNTER — Telehealth: Payer: Self-pay

## 2018-05-19 NOTE — Telephone Encounter (Signed)
-----   Message from Pasty SpillersVarnita B Tahiliani, MD sent at 05/15/2018  1:10 PM EDT ----- Eunice Blaseebbie please let patient know, one of her H Pylori was positive. We need to do an EGD to get biopsies to see if the bacteria is present on biopsies.

## 2018-05-19 NOTE — Telephone Encounter (Signed)
LVM for pt to call office regarding results with the assistance of [facility] Interpreter named KimballtonQueen  ID [phone].  [place] Language  Thanks WeekapaugMichelle

## 2018-08-09 ENCOUNTER — Ambulatory Visit: Payer: Medicaid Other | Admitting: Gastroenterology

## 2018-10-17 ENCOUNTER — Other Ambulatory Visit: Payer: Self-pay | Admitting: Gastroenterology

## 2020-01-14 ENCOUNTER — Encounter: Payer: Self-pay | Admitting: Family Medicine

## 2020-01-14 ENCOUNTER — Ambulatory Visit (INDEPENDENT_AMBULATORY_CARE_PROVIDER_SITE_OTHER): Payer: Self-pay | Admitting: Family Medicine

## 2020-01-14 ENCOUNTER — Other Ambulatory Visit: Payer: Self-pay

## 2020-01-14 VITALS — BP 128/73 | HR 79 | Temp 97.3°F | Ht 61.0 in | Wt 172.4 lb

## 2020-01-14 DIAGNOSIS — Z1211 Encounter for screening for malignant neoplasm of colon: Secondary | ICD-10-CM | POA: Insufficient documentation

## 2020-01-14 DIAGNOSIS — R3129 Other microscopic hematuria: Secondary | ICD-10-CM

## 2020-01-14 DIAGNOSIS — G4700 Insomnia, unspecified: Secondary | ICD-10-CM | POA: Insufficient documentation

## 2020-01-14 DIAGNOSIS — M8949 Other hypertrophic osteoarthropathy, multiple sites: Secondary | ICD-10-CM

## 2020-01-14 DIAGNOSIS — R635 Abnormal weight gain: Secondary | ICD-10-CM

## 2020-01-14 DIAGNOSIS — M159 Polyosteoarthritis, unspecified: Secondary | ICD-10-CM

## 2020-01-14 DIAGNOSIS — Z0000 Encounter for general adult medical examination without abnormal findings: Secondary | ICD-10-CM | POA: Insufficient documentation

## 2020-01-14 DIAGNOSIS — J302 Other seasonal allergic rhinitis: Secondary | ICD-10-CM | POA: Insufficient documentation

## 2020-01-14 DIAGNOSIS — L853 Xerosis cutis: Secondary | ICD-10-CM | POA: Insufficient documentation

## 2020-01-14 DIAGNOSIS — M25561 Pain in right knee: Secondary | ICD-10-CM

## 2020-01-14 DIAGNOSIS — G5603 Carpal tunnel syndrome, bilateral upper limbs: Secondary | ICD-10-CM | POA: Insufficient documentation

## 2020-01-14 DIAGNOSIS — Z13220 Encounter for screening for lipoid disorders: Secondary | ICD-10-CM

## 2020-01-14 DIAGNOSIS — G8929 Other chronic pain: Secondary | ICD-10-CM | POA: Insufficient documentation

## 2020-01-14 LAB — POCT URINALYSIS DIPSTICK
Bilirubin, UA: NEGATIVE
Glucose, UA: NEGATIVE
Ketones, UA: NEGATIVE
Leukocytes, UA: NEGATIVE
Nitrite, UA: NEGATIVE
Protein, UA: NEGATIVE
Spec Grav, UA: 1.01 (ref 1.010–1.025)
Urobilinogen, UA: 0.2 E.U./dL
pH, UA: 5 (ref 5.0–8.0)

## 2020-01-14 MED ORDER — DICLOFENAC SODIUM 50 MG PO TBEC: 50.0000 mg | DELAYED_RELEASE_TABLET | Freq: Two times a day (BID) | ORAL | 2 refills | Status: DC | PRN

## 2020-01-14 MED ORDER — LORATADINE 10 MG PO TABS: 10.0000 mg | ORAL_TABLET | Freq: Every day | ORAL | 1 refills | Status: DC | PRN

## 2020-01-14 NOTE — Assessment & Plan Note (Signed)
Reports is well controlled with PRN usage of loratadine.  Requesting refill.  Plan: 1. Loratadine rx sent to pharmacy on file.

## 2020-01-14 NOTE — Patient Instructions (Addendum)
As we discussed, you have dry skin on your hands.  You can wash your hands with warm water, pat them dry (so they are still a bit damp), and apply moisturizing lotion such as Cerave or Cetaphil, at least twice daily and can do this before bed as well.  Since you report carpal tunnel symptoms and right knee pain exacerbated with squatting/twisting x 3 years, I have put in a referral to Orthopedics for these to be evaluated.  You should hear from someone within 1 week, if you haven't please let us know and we will follow up with the referral department.  Can take an over the counter Melatonin 3-10mg, once an hour before bed.  Try moving your warm beverage from bedtime to 3 hours before sleep so you are not interrupted with having to use the rest room in the middle of the night.  I have included more information on sleep hygiene routine below.  Sleep hygiene is the single most effective treatment for sleep issues, but it is hard work.  Tips for a good night's sleep:  -Keep sleep environment comfortable and conducive to sleep -Keep regular sleep schedule 7 nights a week -Avoiding naps during the day -Avoiding going to bed until drowsy and ready to sleep, not trying to sleep, and not watching the clock -Get out of bed if not asleep within 15-20 minutes and returning only when drowsy -Avoiding caffeine, nicotine, alcohol, and other substances that interfere with sleep before bedtime -Take an hour before your set bedtime and start to wind down: bath/shower, no more TV or phone (the blue light can interfere with sleeping), listen to soothing music, or meditation -No TV in your bedroom -Exercising regularly, at least 6 hours before sleep. Yoga and Tai Chi can improve sleep quality  There are a lot of books and apps that may help guide you with any of the following:   -Progressive muscle relaxation (involves methodical tension and relaxation of different Muscle groups throughout body)  Guided  imagery  -YouTube - Gwynne Edinger has free videos on YouTube that can help with meditation and some   Abdominal breathing   Over the counter sleep aid one hour before bed- and gradually wean your use over 2-4 weeks  Some examples are : *Melatonin 5-10 mg *Sleepology (Can find on [employer]) taken according to packaging directions  There are a few online evidence based online programs, unfortunately they are not free.   Developed by a sleep expert who created a drug-free program for insomnia proven more effective than sleeping pills.  www.cbtforinsomnia.com Sleepio is an evidence-based digital sleep improvement program   www.sleepio.com SHUTi is designed to actively help retrain your body and mind for great sleep through six engaging Cognitive Behavioral Therapy for Insomnia strategy and learning sessions  [email or web address]  Well Visit, Ages 20 to 73: Care Instructions Overview  Well visits can help you stay healthy. Your provider has checked your overall health and may have suggested ways to take good care of yourself. Your provider also may have recommended tests. At home, you can help prevent illness with healthy eating, regular exercise, and other steps.  Follow-up care is a key part of your treatment and safety. Be sure to make and go to all appointments, and call your provider if you are having problems. It's also a good idea to know your test results and keep a list of the medicines you take.  How can you care for yourself at home?   Get screening tests  that you and your doctor decide on. Screening helps find diseases before any symptoms appear.   Eat healthy foods. Choose fruits, vegetables, whole grains, protein, and low-fat dairy foods. Limit fat, especially saturated fat. Reduce salt in your diet.   Limit alcohol. If you are a man, have no more than 2 drinks a day or 14 drinks a week. If you are a woman, have no more than 1 drink a day or 7 drinks a week.   Get  at least 30 minutes of physical activity on most days of the week.  We recommend you go no more than 2 days in a row without exercise. Walking is a good choice. You also may want to do other activities, such as running, swimming, cycling, or playing tennis or team sports. Discuss any changes in your exercise program with your provider.   Reach and stay at a healthy weight. This will lower your risk for many problems, such as obesity, diabetes, heart disease, and high blood pressure.   Do not smoke or allow others to smoke around you. If you need help quitting, talk to your provider about stop-smoking programs and medicines. These can increase your chances of quitting for good.  Can call 1-800-QUIT-NOW 618-024-4266) for the [facility], assistance with smoking cessation.   Care for your mental health. It is easy to get weighed down by worry and stress. Learn strategies to manage stress, like deep breathing and mindfulness, and stay connected with your family and community. If you find you often feel sad or hopeless, talk with your provider. Treatment can help.   Talk to your provider about whether you have any risk factors for sexually transmitted infections (STIs). You can help prevent STIs if you wait to have sex with a new partner (or partners) until you've each been tested for STIs. It also helps if you use condoms (female or female condoms) and if you limit your sex partners to one person who only has sex with you. Vaccines are available for some STIs, such as HPV (these are age dependent).   Use birth control if it's important to you to prevent pregnancy. Talk with your provider about the choices available and what might be best for you.   If you think you may have a problem with alcohol or drug use, talk to your provider. This includes prescription medicines (such as amphetamines and opioids) and illegal drugs (such as cocaine and methamphetamine). Your provider can help you  figure out what type of treatment is best for you.   If you have concerns about domestic violence or intimate partner violence, there are resources available to you. National Domestic Abuse Hotline (630) 865-6074   Protect your skin from too much sun. When you're outdoors from 10 a.m. to 4 p.m., stay in the shade or cover up with clothing and a hat with a wide brim. Wear sunglasses that block UV rays. Even when it's cloudy, put broad-spectrum sunscreen (SPF 30 or higher) on any exposed skin.   See a dentist one or two times a year for checkups and to have your teeth cleaned.   See an eye doctor once per year for an eye exam.   Wear a seat belt in the car.  When should you call for help?  Watch closely for changes in your health, and be sure to contact your provider if you have any problems or symptoms that concern you.  We will plan to see you back in 1 year for  your next physical  You will receive a survey after today's visit either digitally by e-mail or paper by [employer]. Your experiences and feedback matter to [place].  Please respond so we know how we are doing as we provide care for you.  Call us with any questions/concerns/needs.  It is my goal to be available to you for your health concerns.  Thanks for choosing me to be a partner in your healthcare needs!  Harlin Rain, FNP-C Family Nurse Practitioner [facility] Phone: [phone]

## 2020-01-14 NOTE — Assessment & Plan Note (Signed)
Discussed establishing a sleep hygiene routine and handout provided.  Told patient to stop drinking anything for 2-3 hours before she is going to go to bed, to prevent nocturia.  Discussed if having scratchy/dry throat can try a throat lozenge or even a teaspoon of honey to help with that symptom.  Discussed not turning on the lights when having to go to the restroom in the middle of the night, trying to utilize night lights to prevent from waking up fully.  Discussed not using screens/television for 2 hours before bedtime and to not sleep with the TV on.  Can take over the counter Melatonin 3-10mg  one hour before bed to help with sleep as needed.  Plan: 1. Establish sleep hygiene routine 2. Stop drinking all beverages 2-3 hours before bed 3. Can take melatonin 3-10mg , once as needed nightly for sleep 4. Follow up as needed

## 2020-01-14 NOTE — Assessment & Plan Note (Signed)
Annual physical exam with new findings.  Well adult with acute concerns for dry skin on hands, chronic right knee pain, carpal tunnel bilateral upper extremities and insomnia.  See additional A/P.  Plan: 1. Obtain health maintenance screenings as above according to age. - Increase physical activity to 30 minutes most days of the week.  - Eat healthy diet high in vegetables and fruits; low in refined carbohydrates. - Screening labs and tests as ordered 2. Return 1 year for annual physical.

## 2020-01-14 NOTE — Assessment & Plan Note (Addendum)
Reports chronic right knee pain >3 years.  Has difficulty squatting and pivoting.  Has had some relief of pain with taking diclofenac tablets but no relief of instability.  Requesting to follow with Orthopedics for additional evaluation.  Plan: 1. Diclofenac rx resent to pharmacy 2. Referral to Orthopedics sent.

## 2020-01-14 NOTE — Progress Notes (Signed)
Subjective:    Patient ID: Megan Knight, female    DOB: 06-Jul-1976, 44 y.o.   MRN: [id number]  Ut MACKINZIE VUNCANNON is a 44 y.o. female presenting on 01/14/2020 for Annual Exam (Arthritis- chronic Rt Knee pain with prolong standing and walking., bilateral hand numbness with persistent use of the hands.  The pt currently work in a [employer] were she uses her hands.), Eczema (dry spots, itching on the bilateral hands.), and Insomnia (pt complains of difficulty falling sleep after getting up to go to the bathroom.)   HPI  Annual Physical Exam Patient has been feeling well.  They have multiple acute concerns today. Sleeps 3-5 hours per night interrupted.  HEALTH MAINTENANCE: Weight/BMI: 15lb weight gain since 04/2018 Physical activity: No structured exercise Diet: Regular Seatbelt: Always Sunscreen: Yes PAP: Due 04/2021 HIV Screening: Declined Optometry: As needed Dentistry: As needed  VACCINES: Tetanus: Due, declined today Influenza: Due next season COVID: Discussed  Has acute concerns today for dry skin on hands that has been present for months and reports are worsened when using hand sanitizer.  States washing her hands with soap and water can sometimes make it better, but finds that using the sanitizer that is provided at [facility] and other local stores burns her skin when she uses it.  Has been waking up 2-3 hours after going to bed to use the restroom and finding herself awake for 2-3 hours afterwards and finally able to fall back asleep for a few hours before having to get up for work the next day.  Reports is drinking warm tea before bed to help with a scratchy/dry throat.    Reports numbness/tingling in fingers with increased usage.  States this has been going on for >3 years.  Has tried to use carpal tunnel braces without relief of symptoms.  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  Denies finding relief with anything for her  numbness/tingling in bilateral hands.  Has chronic right knee pain that she is unable to squat/pivot without feeling unstable or that she will fall over.  States this has been going on >3 years.  Reports in the past had taken diclofenac for the pain with good relief but did not change her instability.  Has not met with Orthopedics or had imaging further than an xray on her right knee.  Depression screen PHQ 2/9 03/06/2018  Decreased Interest 0  Down, Depressed, Hopeless 0  PHQ - 2 Score 0  Altered sleeping 0  Tired, decreased energy 0  Change in appetite 0  Feeling bad or failure about yourself  0  Trouble concentrating 0  Moving slowly or fidgety/restless 0  Suicidal thoughts 0  PHQ-9 Score 0  Difficult doing work/chores Not difficult at all    Social History   Tobacco Use  . Smoking status: Never Smoker  . Smokeless tobacco: Never Used  Substance Use Topics  . Alcohol use: Yes    Comment: rarely < 2 per occasion, less than monthly consumption of alcohol  . Drug use: No    Review of Systems  Constitutional: Negative.   HENT: Negative.   Eyes: Negative.   Respiratory: Negative.   Cardiovascular: Negative.   Gastrointestinal: Negative.   Endocrine: Negative.   Genitourinary: Negative.   Musculoskeletal: Positive for arthralgias and myalgias. Negative for back pain, gait problem, joint swelling, neck pain and neck stiffness.  Skin: Positive for rash. Negative for color change, pallor and wound.  Allergic/Immunologic: Negative.   Neurological: Positive for numbness. Negative for dizziness, tremors, seizures, syncope, facial asymmetry, speech difficulty, weakness, light-headedness and headaches.  Hematological: Negative.   Psychiatric/Behavioral: Positive for sleep disturbance. Negative for agitation, behavioral problems, confusion, decreased concentration, dysphoric mood, hallucinations, self-injury and suicidal ideas. The patient is not nervous/anxious and is not hyperactive.     Per HPI unless specifically indicated above     Objective:    BP 128/73   Pulse 79   Temp (!) 97.3 F (36.3 C) (Temporal)   Ht _0  (1.549 m)   Wt 172 lb 6.4 oz (78.2 kg)   BMI 32.57 kg/m   Wt Readings from Last 3 Encounters:  01/14/20 172 lb 6.4 oz (78.2 kg)  05/08/18 157 lb 9.6 oz (71.5 kg)  05/08/18 157 lb 9.6 oz (71.5 kg)    Physical Exam Vitals reviewed.  Constitutional:      General: She is not in acute distress.    Appearance: Normal appearance. She is well-developed and well-groomed. She is obese. She is not ill-appearing or toxic-appearing.  HENT:     Head: Normocephalic.     Right Ear: Tympanic membrane, ear canal and external ear normal. There is no impacted cerumen.     Left Ear: Tympanic membrane, ear canal and external ear normal. There is no impacted cerumen.     Nose: Nose normal. No congestion or rhinorrhea.     Mouth/Throat:     Lips: Pink.     Mouth: Mucous membranes are moist.     Tongue: No lesions.     Palate: No lesions.     Pharynx: Oropharynx is clear. Uvula midline. No oropharyngeal exudate or posterior oropharyngeal erythema.  Eyes:     General: Lids are normal. Vision grossly intact. No scleral icterus.       Right eye: No discharge.        Left eye: No discharge.     Extraocular Movements: Extraocular movements intact.     Conjunctiva/sclera: Conjunctivae normal.     Pupils: Pupils are equal, round, and reactive to light.  Neck:     Thyroid: No thyroid mass or thyromegaly.  Cardiovascular:     Rate and Rhythm: Normal rate and regular rhythm.     Pulses: Normal pulses.          Dorsalis pedis pulses are 2+ on the right side and 2+ on the left side.     Heart sounds: Normal heart sounds. No murmur. No friction rub. No gallop.   Pulmonary:     Effort: Pulmonary effort is normal. No respiratory distress.     Breath sounds: Normal breath sounds.  Abdominal:     General: Abdomen is flat. Bowel sounds are normal. There is no distension.       Palpations: Abdomen is soft. There is no hepatomegaly, splenomegaly or mass.     Tenderness: There is no abdominal tenderness. There is no guarding or rebound.     Hernia: No hernia is present.  Musculoskeletal:        General: Tenderness present. Normal range of motion.     Right wrist: Normal.     Left wrist: Normal.     Right hand: No tenderness. Normal range of motion. Normal strength. Normal sensation. Normal capillary refill. Normal pulse.     Left hand: No tenderness. Normal range of motion. Normal strength. Normal sensation. Normal capillary refill. Normal pulse.     Cervical back: Normal range of motion and neck supple. No  tenderness.     Right knee: No swelling, deformity, effusion or erythema. Normal range of motion. No tenderness. Normal pulse.     Left knee: Normal.     Right lower leg: No edema.     Left lower leg: No edema.     Comments: Positive phalen and tinel's sign bilaterally.  Discomfort with squatting and pivoting, slight discomfort with all knee testing in clinic.  Feet:     Right foot:     Skin integrity: Skin integrity normal.     Left foot:     Skin integrity: Skin integrity normal.  Lymphadenopathy:     Cervical: No cervical adenopathy.  Skin:    General: Skin is warm and dry.     Capillary Refill: Capillary refill takes less than 2 seconds.     Findings: Rash present. Rash is crusting.     Comments: Dry skin dermatitis on bilateral back of hands likely secondary to hand sanitizer usage.  Neurological:     General: No focal deficit present.     Mental Status: She is alert and oriented to person, place, and time.     Cranial Nerves: No cranial nerve deficit.     Sensory: No sensory deficit.     Motor: No weakness.     Coordination: Coordination normal.     Gait: Gait normal.     Deep Tendon Reflexes: Reflexes normal.  Psychiatric:        Attention and Perception: Attention and perception normal.        Mood and Affect: Mood and affect normal.         Speech: Speech normal.        Behavior: Behavior normal. Behavior is cooperative.        Thought Content: Thought content normal.        Cognition and Memory: Cognition and memory normal.        Judgment: Judgment normal.    Results for orders placed or performed in visit on 01/14/20  POCT Urinalysis Dipstick  Result Value Ref Range   Color, UA Yellow    Clarity, UA clear    Glucose, UA Negative Negative   Bilirubin, UA negative    Ketones, UA negative    Spec Grav, UA 1.010 1.010 - 1.025   Blood, UA trace    pH, UA 5.0 5.0 - 8.0   Protein, UA Negative Negative   Urobilinogen, UA 0.2 0.2 or 1.0 E.U./dL   Nitrite, UA negative    Leukocytes, UA Negative Negative   Appearance     Odor        Assessment & Plan:   Problem List Items Addressed This Visit      Nervous and Auditory   Carpal tunnel syndrome, bilateral    Reports history of carpal tunnel symptoms beginning > 3 years ago when she was pregnant with her youngest child.  Has tried using braces without relief of symptoms.  Presently working in a position with repetitive hand movements which are increasing her numbness/tingling in her hands.  Positive exam for bilateral carpal tunnel in clinic.  Given >3 years, will refer to orthopedics for further evaluation/management.  Plan: 1. Referral to orthopedics        Musculoskeletal and Integument   Dry skin dermatitis    Dry skin likely secondary to hand sanitizer usage.  Discussed to stop using hand sanitizer and begin to use a mild soap and warm water to wash hands, pat hands dry and while still  damp can use Cerave or Cetaphil moisturizing lotion 1-2x per day to help alleviate dry skin symptoms.  Plan: 1. Begin OTC moisturizing lotion as directed 2. Stop using hand sanitizer unless no soap/water to wash hands        Other   Routine medical exam    Annual physical exam with new findings.  Well adult with acute concerns for dry skin on hands, chronic right knee  pain, carpal tunnel bilateral upper extremities and insomnia.  See additional A/P.  Plan: 1. Obtain health maintenance screenings as above according to age. - Increase physical activity to 30 minutes most days of the week.  - Eat healthy diet high in vegetables and fruits; low in refined carbohydrates. - Screening labs and tests as ordered 2. Return 1 year for annual physical.       Relevant Orders   CBC with Differential   COMPLETE METABOLIC PANEL WITH GFR   Lipid Profile   Thyroid Panel With TSH   Chronic pain of right knee    Reports chronic right knee pain >3 years.  Has difficulty squatting and pivoting.  Has had some relief of pain with taking diclofenac tablets but no relief of instability.  Requesting to follow with Orthopedics for additional evaluation.  Plan: 1. Diclofenac rx resent to pharmacy 2. Referral to Orthopedics sent.      Relevant Medications   diclofenac (VOLTAREN) 50 MG EC tablet   Other Relevant Orders   AMB referral to orthopedics   Seasonal allergies    Reports is well controlled with PRN usage of loratadine.  Requesting refill.  Plan: 1. Loratadine rx sent to pharmacy on file.      Relevant Medications   loratadine (CLARITIN) 10 MG tablet   Insomnia    Discussed establishing a sleep hygiene routine and handout provided.  Told patient to stop drinking anything for 2-3 hours before she is going to go to bed, to prevent nocturia.  Discussed if having scratchy/dry throat can try a throat lozenge or even a teaspoon of honey to help with that symptom.  Discussed not turning on the lights when having to go to the restroom in the middle of the night, trying to utilize night lights to prevent from waking up fully.  Discussed not using screens/television for 2 hours before bedtime and to not sleep with the TV on.  Can take over the counter Melatonin 3-10mg one hour before bed to help with sleep as needed.  Plan: 1. Establish sleep hygiene routine 2. Stop  drinking all beverages 2-3 hours before bed 3. Can take melatonin 3-10mg, once as needed nightly for sleep 4. Follow up as needed       Other Visit Diagnoses    Encounter for annual physical exam    -  Primary   Relevant Orders   POCT Urinalysis Dipstick (Completed)   Microscopic hematuria       Relevant Orders   Urinalysis, microscopic only   Primary osteoarthritis involving multiple joints       Relevant Medications   diclofenac (VOLTAREN) 50 MG EC tablet   Bilateral carpal tunnel syndrome       Relevant Medications   diclofenac (VOLTAREN) 50 MG EC tablet   Other Relevant Orders   AMB referral to orthopedics   Weight gain       Relevant Orders   Thyroid Panel With TSH   Screening for lipid disorders       Relevant Orders   Lipid Profile  Meds ordered this encounter  Medications  . diclofenac (VOLTAREN) 50 MG EC tablet    Sig: Take 1 tablet (50 mg total) by mouth 2 (two) times daily as needed for moderate pain (knees and hands).    Dispense:  60 tablet    Refill:  2  . loratadine (CLARITIN) 10 MG tablet    Sig: Take 1 tablet (10 mg total) by mouth daily as needed for allergies.    Dispense:  90 tablet    Refill:  1      Follow up plan: Return in about 1 year (around 01/13/2021) for CPE.   Harlin Rain, [place] Family Nurse Practitioner [facility] 01/14/2020, 12:07 PM

## 2020-01-14 NOTE — Assessment & Plan Note (Signed)
Dry skin likely secondary to hand sanitizer usage.  Discussed to stop using hand sanitizer and begin to use a mild soap and warm water to wash hands, pat hands dry and while still damp can use Cerave or Cetaphil moisturizing lotion 1-2x per day to help alleviate dry skin symptoms.  Plan: 1. Begin OTC moisturizing lotion as directed 2. Stop using hand sanitizer unless no soap/water to wash hands

## 2020-01-14 NOTE — Assessment & Plan Note (Signed)
Reports history of carpal tunnel symptoms beginning > 3 years ago when she was pregnant with her youngest child.  Has tried using braces without relief of symptoms.  Presently working in a position with repetitive hand movements which are increasing her numbness/tingling in her hands.  Positive exam for bilateral carpal tunnel in clinic.  Given >3 years, will refer to orthopedics for further evaluation/management.  Plan: 1. Referral to orthopedics

## 2020-01-15 LAB — URINALYSIS, MICROSCOPIC ONLY
Bacteria, UA: NONE SEEN /HPF
Hyaline Cast: NONE SEEN /LPF

## 2021-01-29 ENCOUNTER — Other Ambulatory Visit: Payer: Self-pay

## 2021-01-29 ENCOUNTER — Emergency Department
Admission: EM | Admit: 2021-01-29 | Discharge: 2021-01-29 | Disposition: A | Discharge status: Home / Self Care | Payer: Medicaid Other | Attending: Emergency Medicine | Admitting: Emergency Medicine

## 2021-01-29 ENCOUNTER — Emergency Department: Payer: Medicaid Other

## 2021-01-29 DIAGNOSIS — J181 Lobar pneumonia, unspecified organism: Secondary | ICD-10-CM | POA: Insufficient documentation

## 2021-01-29 DIAGNOSIS — N63 Unspecified lump in unspecified breast: Secondary | ICD-10-CM

## 2021-01-29 DIAGNOSIS — J189 Pneumonia, unspecified organism: Secondary | ICD-10-CM

## 2021-01-29 DIAGNOSIS — R9389 Abnormal findings on diagnostic imaging of other specified body structures: Secondary | ICD-10-CM

## 2021-01-29 DIAGNOSIS — N6322 Unspecified lump in the left breast, upper inner quadrant: Secondary | ICD-10-CM | POA: Insufficient documentation

## 2021-01-29 LAB — CBC
HCT: 44.4 % (ref 36.0–46.0)
Hemoglobin: 15.3 g/dL — ABNORMAL HIGH (ref 12.0–15.0)
MCH: 30.8 pg (ref 26.0–34.0)
MCHC: 34.5 g/dL (ref 30.0–36.0)
MCV: 89.5 fL (ref 80.0–100.0)
Platelets: 270 10*3/uL (ref 150–400)
RBC: 4.96 MIL/uL (ref 3.87–5.11)
RDW: 12.1 % (ref 11.5–15.5)
WBC: 8.7 10*3/uL (ref 4.0–10.5)
nRBC: 0 % (ref 0.0–0.2)

## 2021-01-29 LAB — BASIC METABOLIC PANEL
Anion gap: 12 (ref 5–15)
BUN: 11 mg/dL (ref 6–20)
CO2: 19 mmol/L — ABNORMAL LOW (ref 22–32)
Calcium: 8.9 mg/dL (ref 8.9–10.3)
Chloride: 105 mmol/L (ref 98–111)
Creatinine, Ser: 0.55 mg/dL (ref 0.44–1.00)
GFR, Estimated: >60 mL/min (ref >60)
Glucose, Bld: 111 mg/dL — ABNORMAL HIGH (ref 70–99)
Potassium: 4.1 mmol/L (ref 3.5–5.1)
Sodium: 136 mmol/L (ref 135–145)

## 2021-01-29 LAB — TROPONIN I (HIGH SENSITIVITY): Troponin I (High Sensitivity): <2 ng/L (ref <18)

## 2021-01-29 LAB — POC URINE PREG, ED: Preg Test, Ur: NEGATIVE

## 2021-01-29 MED ORDER — DICLOFENAC SODIUM 1 % EX GEL: 4.0000 g | Freq: Three times a day (TID) | CUTANEOUS | 0 refills | Status: AC

## 2021-01-29 MED ORDER — DOXYCYCLINE HYCLATE 100 MG PO CAPS: 100.0000 mg | ORAL_CAPSULE | Freq: Two times a day (BID) | ORAL | 0 refills | Status: AC

## 2021-01-29 NOTE — ED Provider Notes (Signed)
[facility] Emergency Department Provider Note  ____________________________________________   Event Date/Time   First MD Initiated Contact with Patient 01/29/21 1321     (approximate)  I have reviewed the triage vital signs and the nursing notes.   HISTORY  Chief Complaint Chest Pain    HPI [name] is a 45 y.o. female here with left breast pain. Pt reports that over the past 6 months, she's noticed a nonpainful area of swelling that feels like something is "tapping" her inside her left breast. She has been ignoring it but it has been fairly consistent, and worsening, so she presents for evaluation. It is mildly painful to touch. Denies any nipple discharge or bleeding. She has a mild cough from allergies but this is not worse. No fevers, chills. No recent travel. Denies h/o breast mass or lesions. Has not had a mammogram. She did have breast augmentation over 10 yr ago. She also c/o some mild "tingling" in her hands b/l worse at night. No weakness. No other complaints. No recent medication changes.       Past Medical History:  Diagnosis Date  . Medical history non-contributory     Patient Active Problem List   Diagnosis Date Noted  . Routine medical exam 01/14/2020  . Dry skin dermatitis 01/14/2020  . Carpal tunnel syndrome, bilateral 01/14/2020  . Chronic pain of right knee 01/14/2020  . Seasonal allergies 01/14/2020  . Insomnia 01/14/2020  . Calculus of gallbladder without cholecystitis without obstruction   . Advanced maternal age in multigravida, second trimester     Past Surgical History:  Procedure Laterality Date  . BREAST SURGERY Bilateral    breast augmentation  . CESAREAN SECTION    . CHOLECYSTECTOMY N/A 11/30/2017   Procedure: LAPAROSCOPIC CHOLECYSTECTOMY;  Surgeon: Ancil Linsey, MD;  Location: [facility] ORS;  Service: General;  Laterality: N/A;  . COSMETIC SURGERY  2006    Prior to Admission medications   Medication Sig  Start Date End Date Taking? Authorizing Provider  diclofenac Sodium (VOLTAREN) 1 % GEL Apply 4 g topically in the morning, at noon, and at bedtime for 8 days. Apply to area of pain L breast, can also apply to wrists/areas of pain at night 01/29/21 02/06/21 Yes Shaune Pollack, MD  doxycycline (VIBRAMYCIN) 100 MG capsule Take 1 capsule (100 mg total) by mouth 2 (two) times daily for 7 days. 01/29/21 02/05/21 Yes Shaune Pollack, MD  diclofenac (VOLTAREN) 50 MG EC tablet Take 1 tablet (50 mg total) by mouth 2 (two) times daily as needed for moderate pain (knees and hands). 01/14/20   Malfi, Jodelle Gross, FNP  loratadine (CLARITIN) 10 MG tablet Take 1 tablet (10 mg total) by mouth daily as needed for allergies. 01/14/20   Malfi, Jodelle Gross, FNP    Allergies Patient has no known allergies.  Family History  Problem Relation Age of Onset  . Cancer - Colon Neg Hx     Social History Social History   Tobacco Use  . Smoking status: Never Smoker  . Smokeless tobacco: Never Used  Vaping Use  . Vaping Use: Never used  Substance Use Topics  . Alcohol use: Yes    Comment: rarely < 2 per occasion, less than monthly consumption of alcohol  . Drug use: No    Review of Systems  Review of Systems  Constitutional: Negative for chills and fever.  HENT: Negative for sore throat.   Respiratory: Positive for chest tightness. Negative for shortness of breath.  Cardiovascular: Positive for chest pain.  Gastrointestinal: Negative for abdominal pain.  Genitourinary: Negative for flank pain.  Musculoskeletal: Negative for neck pain.  Skin: Negative for rash and wound.  Allergic/Immunologic: Negative for immunocompromised state.  Neurological: Negative for weakness and numbness.  Hematological: Does not bruise/bleed easily.  All other systems reviewed and are negative.    ____________________________________________  PHYSICAL EXAM:      VITAL SIGNS: ED Triage Vitals [01/29/21 1154]  Enc Vitals Group     BP  140/81     Pulse Rate 90     Resp 18     Temp 99 F (37.2 C)     Temp Source Oral     SpO2 98 %     Weight 180 lb (81.6 kg)     Height 5\' 3"  (1.6 m)     Head Circumference      Peak Flow      Pain Score 7     Pain Loc      Pain Edu?      Excl. in GC?      Physical Exam Vitals and nursing note reviewed.  Constitutional:      General: She is not in acute distress.    Appearance: She is well-developed.  HENT:     Head: Normocephalic and atraumatic.  Eyes:     Conjunctiva/sclera: Conjunctivae normal.  Cardiovascular:     Rate and Rhythm: Normal rate and regular rhythm.     Heart sounds: Normal heart sounds.  Pulmonary:     Effort: Pulmonary effort is normal. No respiratory distress.     Breath sounds: No wheezing.  Chest:     Comments: Soft, minimally tender nodule left inner upper breast quadrant, though not with discrete margins. No overlying erythema or skin changes. No skin tenting. No dimpling.  Abdominal:     General: There is no distension.  Musculoskeletal:     Cervical back: Neck supple.  Skin:    General: Skin is warm.     Capillary Refill: Capillary refill takes less than 2 seconds.     Findings: No rash.  Neurological:     Mental Status: She is alert and oriented to person, place, and time.     Motor: No abnormal muscle tone.       ____________________________________________   LABS (all labs ordered are listed, but only abnormal results are displayed)  Labs Reviewed  BASIC METABOLIC PANEL - Abnormal; Notable for the following components:      Result Value   CO2 19 (*)    Glucose, Bld 111 (*)    All other components within normal limits  CBC - Abnormal; Notable for the following components:   Hemoglobin 15.3 (*)    All other components within normal limits  POC URINE PREG, ED  TROPONIN I (HIGH SENSITIVITY)  TROPONIN I (HIGH SENSITIVITY)    ____________________________________________  EKG: Normal sinus rhythm, VR 95. PR 164, QRS 64, QTc  452. NO acute St elevations or depressions. ________________________________________  RADIOLOGY All imaging, including plain films, CT scans, and ultrasounds, independently reviewed by me, and interpretations confirmed via formal radiology reads.  ED MD interpretation:   CXR: Left lower lung PNA, small nodule in subpleural area  Official radiology report(s): DG Chest 2 View  Result Date: 01/29/2021 CLINICAL DATA:  Left chest pain and pressure EXAM: CHEST - 2 VIEW COMPARISON:  07/30/2017 FINDINGS: Minimal left lower lung bronchovascular streaky opacity may represent mild early pneumonia. Subpleural left base 7 mm nodule  also present. Right lung is clear. Negative for edema, effusion or pneumothorax. Trachea midline. Normal heart size and vascularity. Remote cholecystectomy. IMPRESSION: Ill-defined left lower lung streaky bronchovascular opacities may represent mild early pneumonia. Additional left base subpleural 7 mm peripheral nodule. Consider short-term follow-up after medical therapy and if persist CT could be performed. Electronically Signed   By: Judie Petit.  Shick M.D.   On: 01/29/2021 12:38    ____________________________________________  PROCEDURES   Procedure(s) performed (including Critical Care):  Procedures  ____________________________________________  INITIAL IMPRESSION / MDM / ASSESSMENT AND PLAN / ED COURSE  As part of my medical decision making, I reviewed the following data within the electronic MEDICAL RECORD NUMBER Nursing notes reviewed and incorporated, Old chart reviewed, Notes from prior ED visits, and [place] Controlled Substance Database       *Ut MISHAEL KRYSIAK was evaluated in Emergency Department on 01/29/2021 for the symptoms described in the history of present illness. She was evaluated in the context of the global COVID-19 pandemic, which necessitated consideration that the patient might be at risk for infection with the SARS-CoV-2 virus that causes COVID-19. Institutional  protocols and algorithms that pertain to the evaluation of patients at risk for COVID-19 are in a state of rapid change based on information released by regulatory bodies including the CDC and federal and state organizations. These policies and algorithms were followed during the patient's care in the ED.  Some ED evaluations and interventions may be delayed as a result of limited staffing during the pandemic.*     Medical Decision Making:  45 yo F here with atypical L upper breast pain, relating to a small, soft, mobile nodule on her exam. No overlying skin changes. She did have an implant so could be related to scar tissue from this, fibrocystic change, but will refer her urgently to breast center for more definitive imaging to r/o mass/lesion, cancer, complication of her implants. No signs of infection. Otherwise, she does incidentally have a LLL infiltrate. She's had a mild cough though she does have some mild allergies. Small nodule noted as well. States she is from [place] and has no high risk factors for TB. Will tx for possible CAP, d/c with close breast center f/u and PCP f/u for repeat chest imaging.  ____________________________________________  FINAL CLINICAL IMPRESSION(S) / ED DIAGNOSES  Final diagnoses:  Breast nodule  Abnormal x-ray  Community acquired pneumonia of left lower lobe of lung     MEDICATIONS GIVEN DURING THIS VISIT:  Medications - No data to display   ED Discharge Orders         Ordered    doxycycline (VIBRAMYCIN) 100 MG capsule  2 times daily        01/29/21 1447    diclofenac Sodium (VOLTAREN) 1 % GEL  3 times daily        01/29/21 1447           Note:  This document was prepared using Dragon voice recognition software and may include unintentional dictation errors.   Shaune Pollack, MD 01/29/21 [phone]

## 2021-01-29 NOTE — ED Triage Notes (Signed)
Pt states she started having chest pain about 6 months ago that was on and off but states that over the past couple days it has gotten more consistant

## 2021-01-29 NOTE — Discharge Instructions (Addendum)
For your breast mass:  Apply the VOLTAREN gel for pain  CALL THE BREAST IMAGING CENTER at the number above. You need to have a MAMMOGRAM or ULTRASOUND performed in the next week.  It is VERY important to do this. If you have difficulty, call your primary doctor to help.  Call your primary for repeat chest X ray after taking the antibiotics.

## 2021-02-03 ENCOUNTER — Other Ambulatory Visit: Payer: Self-pay | Admitting: *Deleted

## 2021-02-03 ENCOUNTER — Ambulatory Visit
Admission: RE | Admit: 2021-02-03 | Discharge: 2021-02-03 | Disposition: A | Discharge status: Home / Self Care | Payer: Self-pay | Source: Ambulatory Visit | Attending: Oncology | Admitting: Oncology

## 2021-02-03 ENCOUNTER — Other Ambulatory Visit: Payer: Self-pay

## 2021-02-03 ENCOUNTER — Ambulatory Visit: Payer: Medicaid Other | Attending: Oncology | Admitting: *Deleted

## 2021-02-03 VITALS — BP 119/85 | HR 84 | Temp 97.2°F | Ht 63.4 in | Wt 175.7 lb

## 2021-02-03 DIAGNOSIS — N63 Unspecified lump in unspecified breast: Secondary | ICD-10-CM

## 2021-02-03 NOTE — Patient Instructions (Signed)
Gave patient hand-out, Women Staying Healthy, Active and Well from [facility], with education on breast health, pap smears, heart and colon health. 

## 2021-02-03 NOTE — Progress Notes (Signed)
Subjective:     Patient ID: Megan Knight, female   DOB: 07/13/1976, 45 y.o.   MRN: [id number]  HPI   [facility] Medical History Record - 02/03/21 8676      Breast History   Screening cycle New    CBE Date 02/03/21    Provider (CBE) [facility] Ditto    Initial Mammogram 02/03/21    Last Mammogram Never    Recent Breast Symptoms Lump;Pain   lower inner left breast pain x 6 months with mass     Breast Cancer History   Breast Cancer History No personal or family history      Previous History of Breast Problems   Breast Surgery or Biopsy None    Breast Implants --   bilateral   BSE Done --   sometimes     Gynecological/Obstetrical History   LMP --   implant birth control keeps cycle irregular   Is there any chance that the client could be pregnant?  No    Age at menarche 1    Age at menopause n/a    PAP smear history Annually    Date of last PAP  05/08/18    Provider (PAP) Wilhelmina Mcardle FNP    Age at first live birth 20    Breast fed children No    DES Exposure No    Cervical, Uterine or Ovarian cancer No    Family history of Cervial, Uterine or Ovarian cancer No    Hysterectomy No    Cervix removed No    Ovaries removed No    Laser/Cryosurgery No    Current method of birth control Depoprovera    Current method of Estrogen/Hormone replacement None    Smoking history None    Comments no insurance            Review of Systems     Objective:   Physical Exam Chest:  Breasts:     Right: No swelling, bleeding, inverted nipple, mass, nipple discharge, skin change, tenderness, axillary adenopathy or supraclavicular adenopathy.     Left: Mass and tenderness present. No swelling, bleeding, inverted nipple, nipple discharge, skin change, axillary adenopathy or supraclavicular adenopathy.     Lymphadenopathy:     Upper Body:     Right upper body: No supraclavicular, axillary or pectoral adenopathy.     Left upper body: No supraclavicular, axillary or pectoral  adenopathy.        Assessment:     45 year old Megan Knight (Megan Knight) female referred to [facility] by [facility] ED for further evaluation of a left breast mass.  [employer] interpreter Dedra Skeens # [phone] available via video for interpretation during the interview and exam.  Patient states she has had a left breast mass present for about 6 months.  States she has bilateral breast implants and thought her implant may have ruptured.  States she has pain at the site of concern in the left breast, and presented to the ED on 01/29/21 with left breast mass and pain.  A chest x-ray was completed in the ED revealing a 7 mm lung nodule and possible pneumonia.  Patient states she was treated with oral meds and referred to [facility] to follow up on her breast mass.  On clinical breast exam I can visually see a faint raised area at 10-12:00 left breast.  I can palpate with some difficulty an approximate 1.5 cm mobile nodule at 10-11:00 left breast with an asymmetical thickening noted from 10-1:00.  Taught self breast  awareness.  Last pap on 05/08/18 was negative / negative.  Next pap due in 2024.  Patient has been screened for eligibility.  She does not have any insurance, Medicare or Medicaid.  She also meets financial eligibility.   Risk Assessment    Risk Scores      02/03/2021   Last edited by: Scarlett Presto, RN   5-year risk: 0.3 %   Lifetime risk: 3.3 %            Plan:     Will order bilateral diagnostic mammogram and ultrasound.  Patient informed to follow up with her PCP after finishing her oral treatment, or I may refer her to our lung nodule clinic.  Will discuss with patient after imaging results are available.

## 2021-02-05 ENCOUNTER — Encounter: Payer: Self-pay | Admitting: *Deleted

## 2021-02-05 NOTE — Progress Notes (Signed)
Patient with ruptured breast implant on imaging at the site of concern.  She was recommended to return for annual mammogram in 1 year.

## 2022-01-21 ENCOUNTER — Encounter: Payer: Self-pay | Admitting: Internal Medicine

## 2022-01-21 ENCOUNTER — Ambulatory Visit (INDEPENDENT_AMBULATORY_CARE_PROVIDER_SITE_OTHER): Payer: Medicaid Other | Admitting: Internal Medicine

## 2022-01-21 VITALS — BP 116/78 | HR 84 | Temp 97.5°F | Ht 63.0 in | Wt 147.0 lb

## 2022-01-21 DIAGNOSIS — Z0001 Encounter for general adult medical examination with abnormal findings: Secondary | ICD-10-CM

## 2022-01-21 DIAGNOSIS — Z1231 Encounter for screening mammogram for malignant neoplasm of breast: Secondary | ICD-10-CM | POA: Diagnosis not present

## 2022-01-21 DIAGNOSIS — Z114 Encounter for screening for human immunodeficiency virus [HIV]: Secondary | ICD-10-CM

## 2022-01-21 DIAGNOSIS — Z1159 Encounter for screening for other viral diseases: Secondary | ICD-10-CM

## 2022-01-21 DIAGNOSIS — Z6826 Body mass index (BMI) 26.0-26.9, adult: Secondary | ICD-10-CM

## 2022-01-21 DIAGNOSIS — Z789 Other specified health status: Secondary | ICD-10-CM

## 2022-01-21 DIAGNOSIS — E66811 Obesity, class 1: Secondary | ICD-10-CM | POA: Insufficient documentation

## 2022-01-21 DIAGNOSIS — E663 Overweight: Secondary | ICD-10-CM | POA: Insufficient documentation

## 2022-01-21 NOTE — Assessment & Plan Note (Signed)
Encouraged diet and exercise for weight loss ?

## 2022-01-21 NOTE — Progress Notes (Signed)
Subjective:    Patient ID: Megan Knight, female    DOB: 1976/05/11, 46 y.o.   MRN: [id number]  HPI  Pt presents to clinic today for her annual exam.  Flu: never Tetanus: unsure COVID: x 3 Pap smear: 04/2018 Mammogram: 01/2021 Colon screening: Never Vision screening: as needed Dentist: biannually  Diet: She does eat  meat. She consumes more veggies than fruit. She tries to avoid fried foods. She drinks mostly water. Exercise: 45 min 1 hour at home  Review of Systems     Past Medical History:  Diagnosis Date   Medical history non-contributory     Current Outpatient Medications  Medication Sig Dispense Refill   diclofenac (VOLTAREN) 50 MG EC tablet Take 1 tablet (50 mg total) by mouth 2 (two) times daily as needed for moderate pain (knees and hands). 60 tablet 2   loratadine (CLARITIN) 10 MG tablet Take 1 tablet (10 mg total) by mouth daily as needed for allergies. 90 tablet 1   No current facility-administered medications for this visit.    No Known Allergies  Family History  Problem Relation Age of Onset   Cancer - Colon Neg Hx     Social History   Socioeconomic History   Marital status: Divorced    Spouse name: Not on file   Number of children: Not on file   Years of education: Not on file   Highest education level: Not on file  Occupational History   Not on file  Tobacco Use   Smoking status: Never   Smokeless tobacco: Never  Vaping Use   Vaping Use: Never used  Substance and Sexual Activity   Alcohol use: Yes    Comment: rarely < 2 per occasion, less than monthly consumption of alcohol   Drug use: No   Sexual activity: Yes    Birth control/protection: Implant    Comment: mutually monogamous relationship  Other Topics Concern   Not on file  Social History Narrative   Not on file   Social Determinants of Health   Financial Resource Strain: Not on file  Food Insecurity: Not on file  Transportation Needs: Not on file  Physical Activity: Not  on file  Stress: Not on file  Social Connections: Not on file  Intimate Partner Violence: Not on file     Constitutional: Denies fever, malaise, fatigue, headache or abrupt weight changes.  HEENT: Denies eye pain, eye redness, ear pain, ringing in the ears, wax buildup, runny nose, nasal congestion, bloody nose, or sore throat. Respiratory: Denies difficulty breathing, shortness of breath, cough or sputum production.   Cardiovascular: Denies chest pain, chest tightness, palpitations or swelling in the hands or feet.  Gastrointestinal: Denies abdominal pain, bloating, constipation, diarrhea or blood in the stool.  GU: Denies urgency, frequency, pain with urination, burning sensation, blood in urine, odor or discharge. Musculoskeletal: Denies decrease in range of motion, difficulty with gait, muscle pain or joint pain and swelling.  Skin: Denies redness, rashes, lesions or ulcercations.  Neurological: Denies dizziness, difficulty with memory, difficulty with speech or problems with balance and coordination.  Psych: Denies anxiety, depression, SI/HI.  No other specific complaints in a complete review of systems (except as listed in HPI above).  Objective:   Physical Exam  BP 116/78 (BP Location: Left Arm, Patient Position: Sitting, Cuff Size: Normal)   Pulse 84   Temp (!) 97.5 F (36.4 C) (Temporal)   Ht 5' 3"  (1.6 m)   Wt 147 lb (66.7  kg)   SpO2 100%   BMI 26.04 kg/m   Wt Readings from Last 3 Encounters:  02/03/21 175 lb 11.2 oz (79.7 kg)  01/29/21 180 lb (81.6 kg)  01/14/20 172 lb 6.4 oz (78.2 kg)    General: Appears  her stated age, overweight, in NAD. Skin: Warm, dry and intact.  HEENT: Head: normal shape and size; Eyes: sclera white, no icterus, conjunctiva pink, PERRLA and EOMs intact;  Neck:  Neck supple, trachea midline. No masses, lumps or thyromegaly present.  Cardiovascular: Normal rate and rhythm. S1,S2 noted.  No murmur, rubs or gallops noted. No JVD or BLE edema.   Pulmonary/Chest: Normal effort and positive vesicular breath sounds. No respiratory distress. No wheezes, rales or ronchi noted.  Abdomen: Soft and nontender. Normal bowel sounds.  Musculoskeletal: Strength 5/5 BUE/BLE. No difficulty with gait.  Neurological: Alert and oriented. Cranial nerves II-XII grossly intact. Coordination normal.  Psychiatric: Mood and affect normal. Behavior is normal. Judgment and thought content normal.    BMET    Component Value Date/Time   NA 136 01/29/2021 1200   K 4.1 01/29/2021 1200   CL 105 01/29/2021 1200   CO2 19 (L) 01/29/2021 1200   GLUCOSE 111 (H) 01/29/2021 1200   BUN 11 01/29/2021 1200   CREATININE 0.55 01/29/2021 1200   CREATININE 0.78 05/02/2018 1034   CALCIUM 8.9 01/29/2021 1200   GFRNONAA >60 01/29/2021 1200   GFRNONAA 94 05/02/2018 1034   GFRAA 109 05/02/2018 1034    Lipid Panel     Component Value Date/Time   CHOL 171 05/02/2018 1034   TRIG 250 (H) 05/02/2018 1034   HDL 40 (L) 05/02/2018 1034   CHOLHDL 4.3 05/02/2018 1034   LDLCALC 95 05/02/2018 1034    CBC    Component Value Date/Time   WBC 8.7 01/29/2021 1200   RBC 4.96 01/29/2021 1200   HGB 15.3 (H) 01/29/2021 1200   HCT 44.4 01/29/2021 1200   PLT 270 01/29/2021 1200   MCV 89.5 01/29/2021 1200   MCH 30.8 01/29/2021 1200   MCHC 34.5 01/29/2021 1200   RDW 12.1 01/29/2021 1200   LYMPHSABS 3,032 05/02/2018 1034   MONOABS 0.7 11/30/2017 0821   EOSABS 1,142 (H) 05/02/2018 1034   BASOSABS 42 05/02/2018 1034    Hgb A1C Lab Results  Component Value Date   HGBA1C 5.8 (H) 05/02/2018           Assessment & Plan:   Preventative Health Maintenance:  Encouraged her to get a flu shot in the fall She declines tetanus Encouraged her to get her COVID-vaccine Pap smear UTD Mammogram ordered-she will call to schedule She declines referral to GI for screening colonoscopy or Cologuard Encouraged her to consume a balanced diet and exercise regimen Advised her to see  an eye doctor and dentist annually We will check CBC, c-Met, lipid, A1c, HIV and hep C today  RTC in 6 months, follow-up chronic conditions Webb Silversmith, NP

## 2022-01-21 NOTE — Addendum Note (Signed)
Addended by: Lorre Munroe on: 01/21/2022 02:43 PM   Modules accepted: Orders

## 2022-01-21 NOTE — Patient Instructions (Signed)

## 2022-01-22 ENCOUNTER — Telehealth: Payer: Self-pay

## 2022-01-22 LAB — HEPATITIS C ANTIBODY
Hepatitis C Ab: NONREACTIVE
SIGNAL TO CUT-OFF: 0.06 (ref <1.00)

## 2022-01-22 LAB — CBC
HCT: 45.2 % — ABNORMAL HIGH (ref 35.0–45.0)
Hemoglobin: 14.9 g/dL (ref 11.7–15.5)
MCH: 30.9 pg (ref 27.0–33.0)
MCHC: 33 g/dL (ref 32.0–36.0)
MCV: 93.8 fL (ref 80.0–100.0)
MPV: 10.7 fL (ref 7.5–12.5)
Platelets: 297 10*3/uL (ref 140–400)
RBC: 4.82 10*6/uL (ref 3.80–5.10)
RDW: 12.6 % (ref 11.0–15.0)
WBC: 8.3 10*3/uL (ref 3.8–10.8)

## 2022-01-22 LAB — LIPID PANEL
Cholesterol: 199 mg/dL (ref <200)
HDL: 44 mg/dL — ABNORMAL LOW (ref >=50)
LDL Cholesterol (Calc): 132 mg/dL (calc) — ABNORMAL HIGH
Non-HDL Cholesterol (Calc): 155 mg/dL (calc) — ABNORMAL HIGH (ref <130)
Total CHOL/HDL Ratio: 4.5 (calc) (ref <5.0)
Triglycerides: 123 mg/dL (ref <150)

## 2022-01-22 LAB — COMPLETE METABOLIC PANEL WITH GFR
AG Ratio: 1.4 (calc) (ref 1.0–2.5)
ALT: 14 U/L (ref 6–29)
AST: 15 U/L (ref 10–35)
Albumin: 4.1 g/dL (ref 3.6–5.1)
Alkaline phosphatase (APISO): 50 U/L (ref 31–125)
BUN: 9 mg/dL (ref 7–25)
CO2: 29 mmol/L (ref 20–32)
Calcium: 9.3 mg/dL (ref 8.6–10.2)
Chloride: 102 mmol/L (ref 98–110)
Creat: 0.6 mg/dL (ref 0.50–0.99)
Globulin: 3 g/dL (calc) (ref 1.9–3.7)
Glucose, Bld: 100 mg/dL — ABNORMAL HIGH (ref 65–99)
Potassium: 4 mmol/L (ref 3.5–5.3)
Sodium: 139 mmol/L (ref 135–146)
Total Bilirubin: 0.5 mg/dL (ref 0.2–1.2)
Total Protein: 7.1 g/dL (ref 6.1–8.1)
eGFR: 113 mL/min/{1.73_m2} (ref >=60)

## 2022-01-22 LAB — HIV ANTIBODY (ROUTINE TESTING W REFLEX): HIV 1&2 Ab, 4th Generation: NONREACTIVE

## 2022-01-22 LAB — HEMOGLOBIN A1C
Hgb A1c MFr Bld: 5.7 % of total Hgb — ABNORMAL HIGH (ref <5.7)
Mean Plasma Glucose: 117 mg/dL
eAG (mmol/L): 6.5 mmol/L

## 2022-01-22 NOTE — Telephone Encounter (Signed)
Megan Knight medical referring for nexplanon removal.Contact patient with interpreters services left voicemail for patient to call back to be scheduled.

## 2022-02-02 NOTE — Telephone Encounter (Signed)
Contact patient with interpreters services left voicemail for patient to call back to be scheduled

## 2022-02-04 NOTE — Telephone Encounter (Signed)
Contacted patient with interpreters services left voicemail for patient to call back to be scheduled.

## 2022-02-08 NOTE — Telephone Encounter (Signed)
Contacting referring provider about multiple attempts to reach patient were unsuccessful.

## 2022-03-23 ENCOUNTER — Other Ambulatory Visit: Payer: Self-pay | Admitting: Internal Medicine

## 2022-03-23 DIAGNOSIS — Z1231 Encounter for screening mammogram for malignant neoplasm of breast: Secondary | ICD-10-CM

## 2022-04-15 ENCOUNTER — Ambulatory Visit
Admission: RE | Admit: 2022-04-15 | Discharge: 2022-04-15 | Disposition: A | Discharge status: Home / Self Care | Payer: Medicaid Other | Source: Ambulatory Visit | Attending: Internal Medicine | Admitting: Internal Medicine

## 2022-04-15 DIAGNOSIS — Z1231 Encounter for screening mammogram for malignant neoplasm of breast: Secondary | ICD-10-CM | POA: Insufficient documentation

## 2022-08-24 ENCOUNTER — Ambulatory Visit (INDEPENDENT_AMBULATORY_CARE_PROVIDER_SITE_OTHER): Payer: Self-pay | Admitting: Plastic Surgery

## 2022-08-24 ENCOUNTER — Encounter: Payer: Self-pay | Admitting: Plastic Surgery

## 2022-08-24 VITALS — BP 135/93 | HR 86 | Ht 63.0 in | Wt 145.0 lb

## 2022-08-24 DIAGNOSIS — Z9882 Breast implant status: Secondary | ICD-10-CM

## 2022-08-24 NOTE — Progress Notes (Signed)
Referring Provider Lorre Munroe, NP [address] [place],  [place] [address]   CC:  Chief Complaint  Patient presents with   consult   Consult           [name] is an 46 y.o. female.  HPI: Ms Megan Knight is a 46 year old female who presents today for evaluation for breast implant exchange.  She had implants placed in 2006.  She believes they are 350 cc gel implants but she does not have the documentation she received at the time of the implants were placed.  She states she has gained weight since they were placed and she would like larger implants that are more proportion to her body size.  She is asked for up to 500 cc implants.  On her 2 most recent mammograms the left implant appears to be ruptured.  Both implants appear to be placed in the submuscular position.  No Known Allergies  Outpatient Encounter Medications as of 08/24/2022  Medication Sig   diclofenac (VOLTAREN) 50 MG EC tablet Take 1 tablet (50 mg total) by mouth 2 (two) times daily as needed for moderate pain (knees and hands).   loratadine (CLARITIN) 10 MG tablet Take 1 tablet (10 mg total) by mouth daily as needed for allergies.   No facility-administered encounter medications on file as of 08/24/2022.     Past Medical History:  Diagnosis Date   Medical history non-contributory     Past Surgical History:  Procedure Laterality Date   AUGMENTATION MAMMAPLASTY     BREAST SURGERY Bilateral    breast augmentation   CESAREAN SECTION     CHOLECYSTECTOMY N/A 11/30/2017   Procedure: LAPAROSCOPIC CHOLECYSTECTOMY;  Surgeon: Ancil Linsey, MD;  Location: [facility] ORS;  Service: General;  Laterality: N/A;   COSMETIC SURGERY  2006    Family History  Problem Relation Age of Onset   Cancer - Colon Neg Hx    Breast cancer Neg Hx     Social History   Social History Narrative   Not on file     Review of Systems General: Denies fevers, chills, weight loss CV: Denies chest pain, shortness of breath,  palpitations Breast: Bilateral breast augmentation through a periareolar incision.  The left implant is smaller than the right presumably due to either displacement or rupture. Abdomen: Patient has a round abdomen with excess skin and fat on the anterior abdominal wall.  Physical Exam    08/24/2022    1:36 PM 01/21/2022    2:09 PM 02/03/2021    8:27 AM  Vitals with BMI  Height 5\' 3"  5\' 3"  5' 3.4"  Weight 145 lbs 147 lbs 175 lbs 11 oz  BMI 25.69 26.05 30.75  Systolic 135 116  Diastolic 93 78 85  Pulse 86 84 84    General:  No acute distress,  Alert and oriented, Non-Toxic, Normal speech and affect Breast: The right breast actually looks quite good from an aesthetic standpoint the nipples well-placed with no ptosis.  She has minimal capsular contracture.  The implant base with measures approximately 12 to 12-1/2 cm.  The fold to nipple distance on the right is 10 cm the periareolar incision is well-healed.  On the left the breast is smaller however the nipple position remains good with a well-healed periareolar incision.  The left breast is visibly smaller than the right.  The fold to nipple distance is 8 cm. Abdomen: The patient has excess anterior abdominal wall fat.  She  has a minimal pannus. Mammogram: Mammogram from August 2023 shows a left implant rupture but is otherwise read as BI-RADS 2 Assessment/Plan Breast implants: The patient is requesting replacement of her implants.  She would like to upsize the implants.  We discussed the procedure including the use of an inframammary incision.  We also discussed the risks of infection bleeding seroma formation and implant malposition.  She also understands that placing an implant that is too large will ultimately stretch the skin and will increase the rate at which she develops breast associated ptosis.  We did not discuss breast associated large cell lymphoma but will discuss at her next appointment. Abdominoplasty: The patient also inquired  about abdominoplasty or liposuction.  I do not believe that she is a particularly good candidate for liposuction for contouring her anterior abdominal wall.  She is an acceptable candidate for an abdominoplasty but, as I discussed, she would be a much better candidate if she lost some weight first.  She will consider this and let me know how she would like to proceed.  With procedures could be performed at the same time.  Santiago Glad 08/24/2022, 3:12 PM

## 2022-09-01 ENCOUNTER — Telehealth: Payer: Self-pay | Admitting: *Deleted

## 2022-09-01 NOTE — Telephone Encounter (Signed)
Quote for bilateral Removal and replacement of breast implants mailed to pt. Mychart is not set up. Copy sent to batch scan

## 2022-10-01 ENCOUNTER — Telehealth: Payer: Self-pay | Admitting: Plastic Surgery

## 2022-10-01 NOTE — Telephone Encounter (Signed)
Pt called for appt with Dr. Lovena Le and his assistant to further discuss surgery. Pt says she received a letter in the mail and wants to be seen. Offered to have provider call her back but pt wants to be seen. Scheduled appt 10/28/22. Message sent to provider for review.

## 2022-10-28 ENCOUNTER — Ambulatory Visit (INDEPENDENT_AMBULATORY_CARE_PROVIDER_SITE_OTHER): Payer: Medicaid Other | Admitting: Plastic Surgery

## 2022-10-28 ENCOUNTER — Encounter: Payer: Self-pay | Admitting: Plastic Surgery

## 2022-10-28 VITALS — BP 131/86 | HR 89

## 2022-10-28 DIAGNOSIS — T8543XA Leakage of breast prosthesis and implant, initial encounter: Secondary | ICD-10-CM

## 2022-10-28 DIAGNOSIS — E65 Localized adiposity: Secondary | ICD-10-CM

## 2022-10-28 DIAGNOSIS — Z9882 Breast implant status: Secondary | ICD-10-CM

## 2022-10-28 NOTE — Progress Notes (Signed)
Referring Provider Megan Fenton, NP [place],  [place] [address]   CC:  Chief Complaint  Patient presents with   Follow-up      Megan Knight is an 47 y.o. female.  HPI: Ms. Megan Knight returns today to discuss her body contouring procedures.  She is still interested in removal and replacement of her bilateral breast implants which have been ruptured since at least 2022 and abdominoplasty.  Been unable to lose any weight and she understands that I still do not believe that she will get an ideal result without the weight loss.  No Known Allergies  Outpatient Encounter Medications as of 10/28/2022  Medication Sig   diclofenac (VOLTAREN) 50 MG EC tablet Take 1 tablet (50 mg total) by mouth 2 (two) times daily as needed for moderate pain (knees and hands).   loratadine (CLARITIN) 10 MG tablet Take 1 tablet (10 mg total) by mouth daily as needed for allergies.   No facility-administered encounter medications on file as of 10/28/2022.     Past Medical History:  Diagnosis Date   Medical history non-contributory     Past Surgical History:  Procedure Laterality Date   AUGMENTATION MAMMAPLASTY     BREAST SURGERY Bilateral    breast augmentation   CESAREAN SECTION     CHOLECYSTECTOMY N/A 11/30/2017   Procedure: LAPAROSCOPIC CHOLECYSTECTOMY;  Surgeon: Megan Epley, MD;  Location: [facility] ORS;  Service: General;  Laterality: N/A;   COSMETIC SURGERY  2006    Family History  Problem Relation Age of Onset   Cancer - Colon Neg Hx    Breast cancer Neg Hx     Social History   Social History Narrative   Not on file     Review of Systems General: Denies fevers, chills, weight loss CV: Denies chest pain, shortness of breath, palpitations Breast: Has a known left breast implant that is ruptured and would like to have her implants replaced and upsized.  At her last appointment she told me that she has 350 cc implants bilaterally. Abd: Unhappy with the appearance of her abdomen  and the excess skin and fat especially below the umbilicus.  Physical Exam    10/28/2022    3:03 PM 08/24/2022    1:36 PM 01/21/2022    2:09 PM  Vitals with BMI  Height  '5\' 3"'$  '5\' 3"'$   Weight  145 lbs 147 lbs  BMI  123456 [id number]  Systolic A999333 [id number]  Diastolic 86 93 78  Pulse 89 86 84    General:  No acute distress,  Alert and oriented, Non-Toxic, Normal speech and affect Breast: Patient has minimal breast ptosis.  She does have breast asymmetry however the left breast implant is noted to be ruptured and the capsule is somewhat calcified as per her mammogram in 2022 Abdomen: Patient has a protuberant abdomen with a modest amount of excess skin and fat.  There is a very small pannus with minimal overhang. Mammogram: Last mammogram recorded was 2022.  Will place an order for screening mammogram. Assessment/Plan Ruptured implants: Patient would like removal of the implants and replacement with bilateral silicone implants.  Will perform this through a inframammary incision so that I can also perform a capsulectomy at the same time.  She has breast asymmetry currently and we have discussed the fact that she will have breast asymmetry postoperatively.  I will order sizers for asymmetric implant placement to try to mitigate as much of this asymmetry as  possible.  We discussed the risks of bleeding, infection, seroma formation. Abdomen: The patient would like an abdominoplasty.  She states that she is unable to lose any more weight and understands that the result may not be as nice as I would like.  She understands that it may not be possible to remove all of the skin that includes her umbilical transposition.  She may end up with a small vertical scar just above the transverse incision.  She understands that she will have 2 drains postoperatively and will need to wear compressive garment for 6 weeks postoperatively.  She will have a restriction of no heavy lifting greater than 20 pounds, no vigorous  activity and no submerging the incisions in water for 6 weeks.  All questions were answered through her interpreter to her satisfaction photographs were obtained today with her consent.  Will provide her with a quote and schedule surgery at her request.  Megan Knight 10/28/2022, 5:02 PM

## 2022-11-04 ENCOUNTER — Telehealth: Payer: Self-pay | Admitting: *Deleted

## 2022-11-04 NOTE — Telephone Encounter (Signed)
Updated self-pay quote for Bilateral removal of bilateral breast implants (left is ruptured), capsulectomy, replacement with silicone implants; Abdominoplasty reviewed by Dr. Lovena Le and mailed to patient. Letter sent with quote states we will update quote if insurance agrees to cover part of the surgery. Copies of both sent to batch scan.

## 2022-11-09 ENCOUNTER — Telehealth: Payer: Self-pay | Admitting: Plastic Surgery

## 2022-11-09 NOTE — Telephone Encounter (Signed)
Called to get fax number for Breast imaging at [facility] referral has been faxed to [phone], (for Documentation)

## 2022-11-09 NOTE — Addendum Note (Signed)
Addended by: Lindon Romp on: 11/09/2022 04:31 PM   Modules accepted: Orders

## 2022-11-13 IMAGING — MG MM  DIGITAL DIAGNOSTIC BREAST BILAT IMPLANT W/ TOMO W/ CAD
8 of 17 series · 8 of 40 positions shown · non-contrast
Comparison: None.

CLINICAL DATA: Palpable lump on the left.

EXAM:
DIGITAL DIAGNOSTIC BILATERAL MAMMOGRAM WITH IMPLANTS, CAD AND
TOMOSYNTHESIS; ULTRASOUND LEFT BREAST LIMITED
TECHNIQUE: Bilateral digital diagnostic mammography and breast tomosynthesis
was performed. The images were evaluated with computer-aided
detection. Standard and/or implant displaced views were performed.;
Targeted ultrasound examination of the left breast was performed

[L CC]
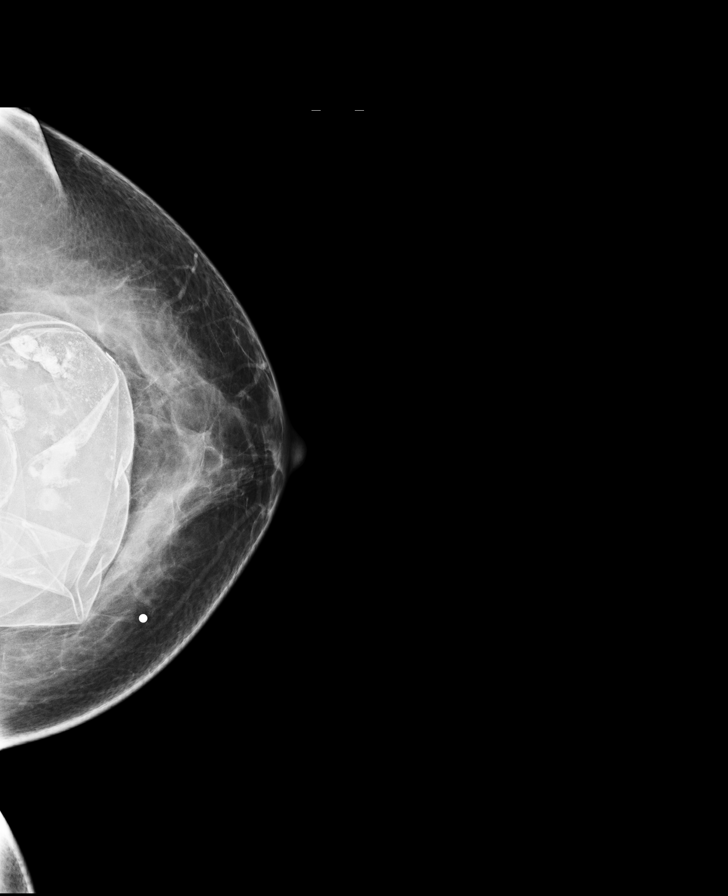

[L MLO]
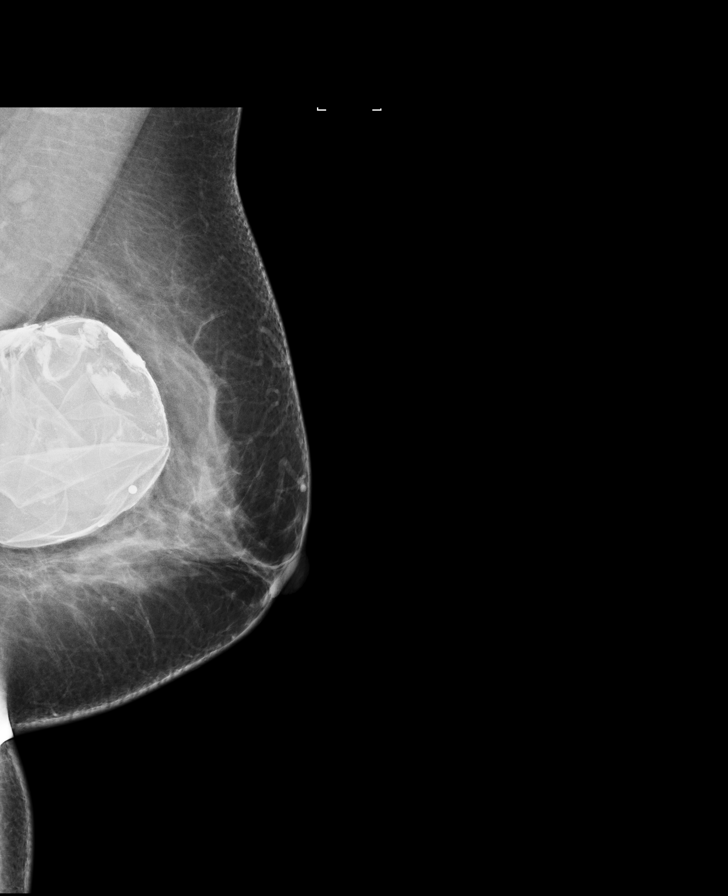

[R MLO]
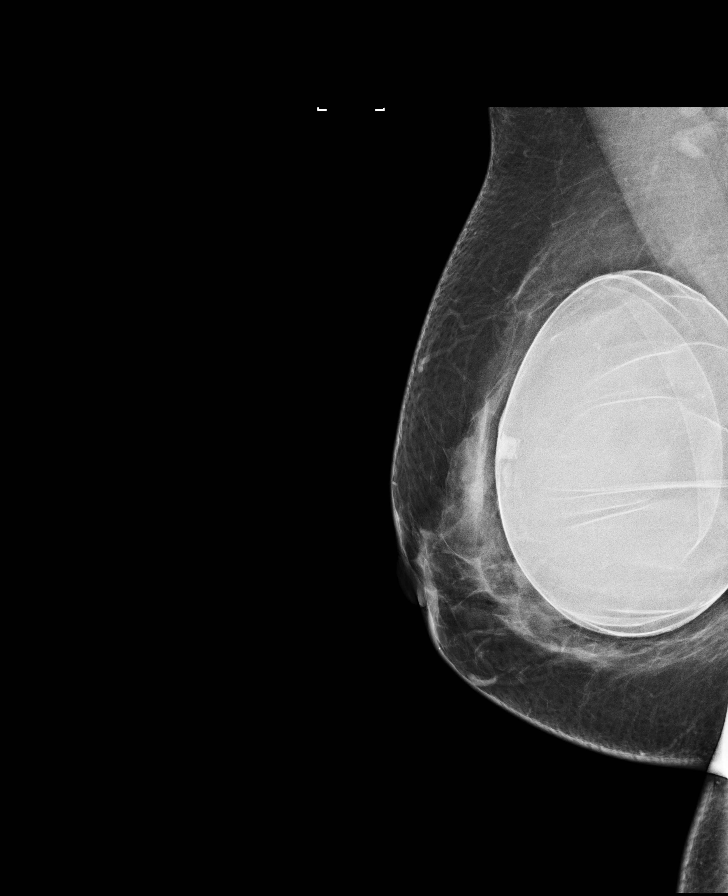

[R CC]
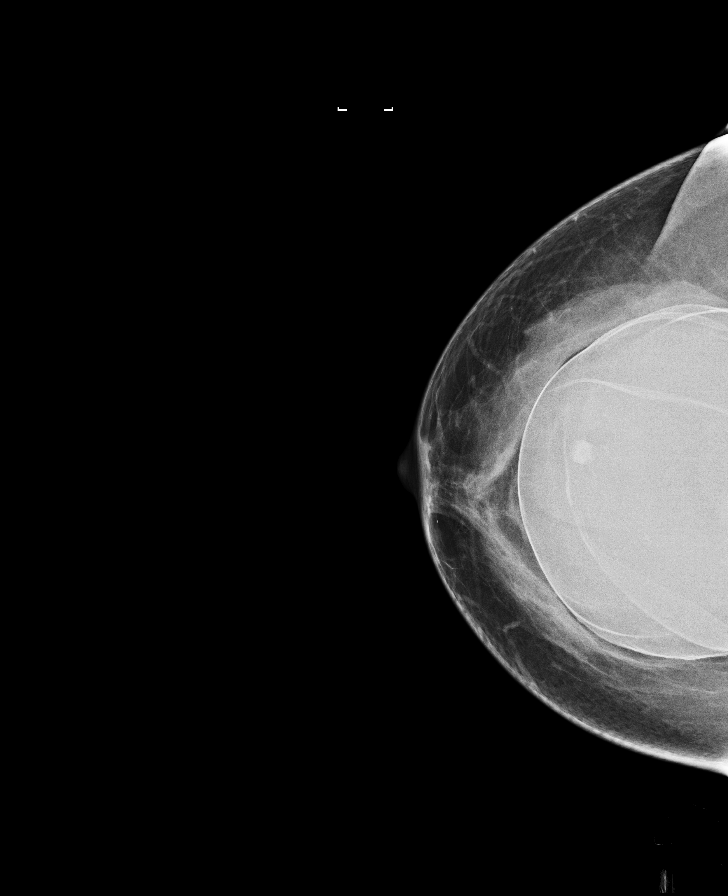

[R MLO synth-2D]
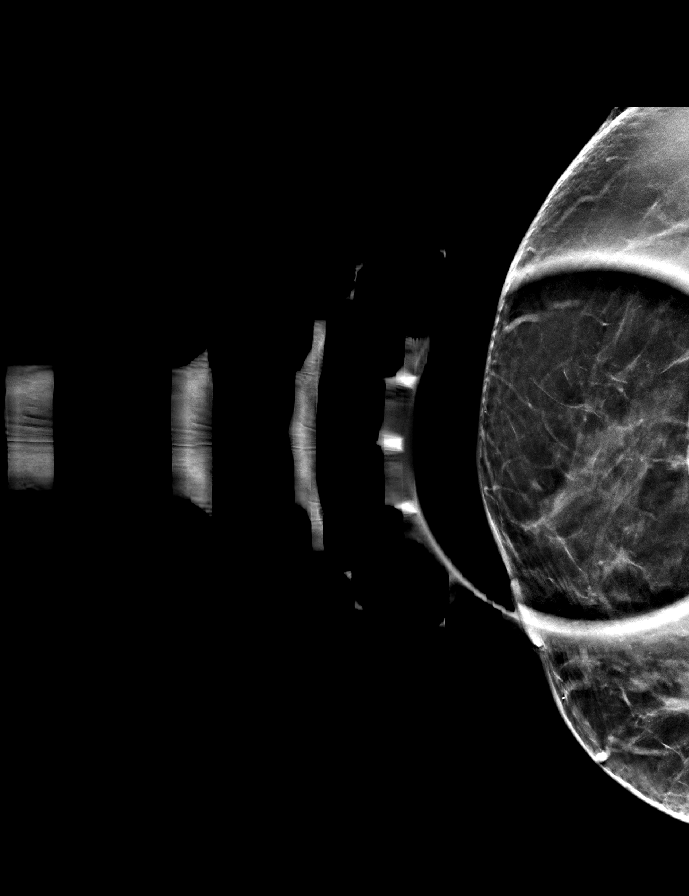

[L TAN synth-2D]
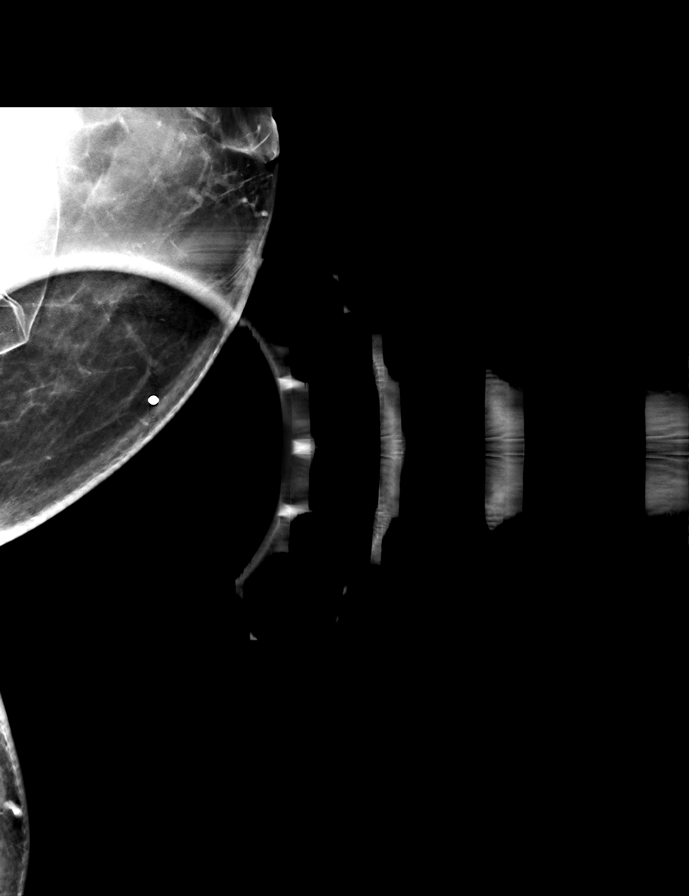

[L CC synth-2D]
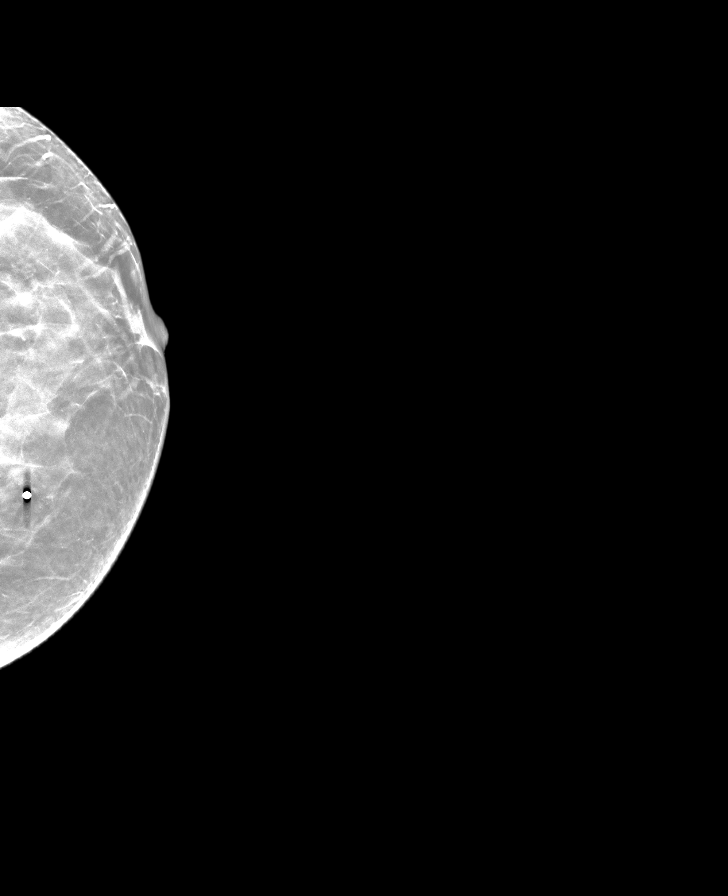

[R CC synth-2D]
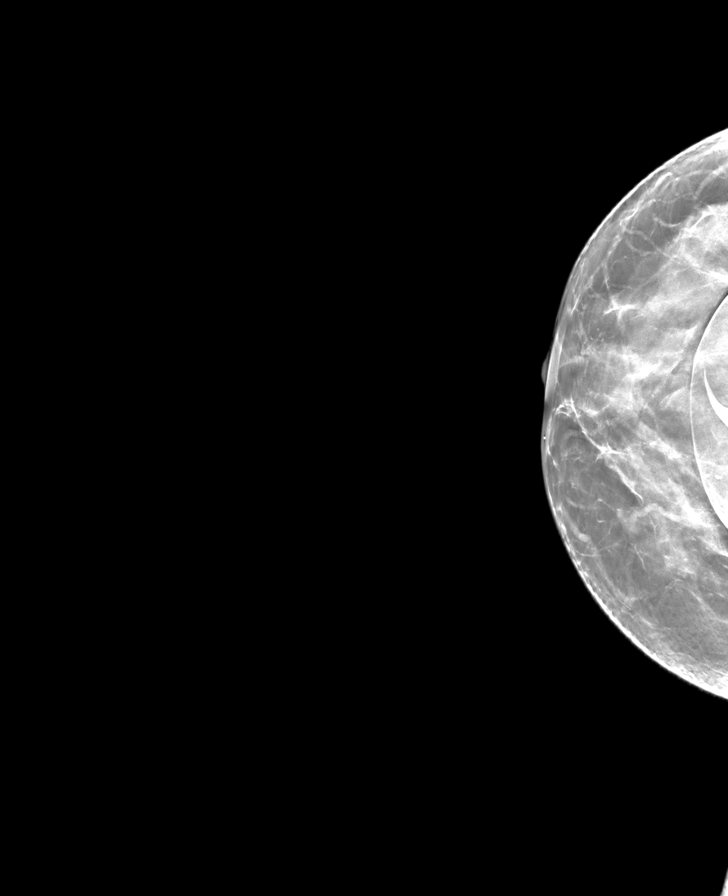

[8 of 40 positions shown; findings below may reference images not displayed]

ACR Breast Density Category c: The breast tissue is heterogeneously
dense, which may obscure small masses.
FINDINGS: No suspicious masses, calcifications, or distortion are seen in
either breast. The patient's left implant appears to be ruptured.
There is also a partially calcified capsule. The patient has
retropectoral implants.

On physical exam, no suspicious lumps are identified.

Targeted ultrasound is performed, showing a partially
collapsed/ruptured left implant. No suspicious masses.
IMPRESSION: Partially collapsed/ruptured left implant.  No suspicious masses.

RECOMMENDATION:
Consider replacement of the patient's left breast implant. Recommend
annual mammography.

I have discussed the findings and recommendations with the patient.
If applicable, a reminder letter will be sent to the patient
regarding the next appointment.

BI-RADS CATEGORY  2: Benign.

## 2022-11-13 IMAGING — US US BREAST*L* LIMITED INC AXILLA
1 series · 7 of 7 positions shown · non-contrast
Comparison: None.

CLINICAL DATA: Palpable lump on the left.

EXAM:
DIGITAL DIAGNOSTIC BILATERAL MAMMOGRAM WITH IMPLANTS, CAD AND
TOMOSYNTHESIS; ULTRASOUND LEFT BREAST LIMITED
TECHNIQUE: Bilateral digital diagnostic mammography and breast tomosynthesis
was performed. The images were evaluated with computer-aided
detection. Standard and/or implant displaced views were performed.;
Targeted ultrasound examination of the left breast was performed

[Series 1: us breast*left* limited inc axilla · 0.07mm/px · 7 of 7 slices shown]
[im 1/7]
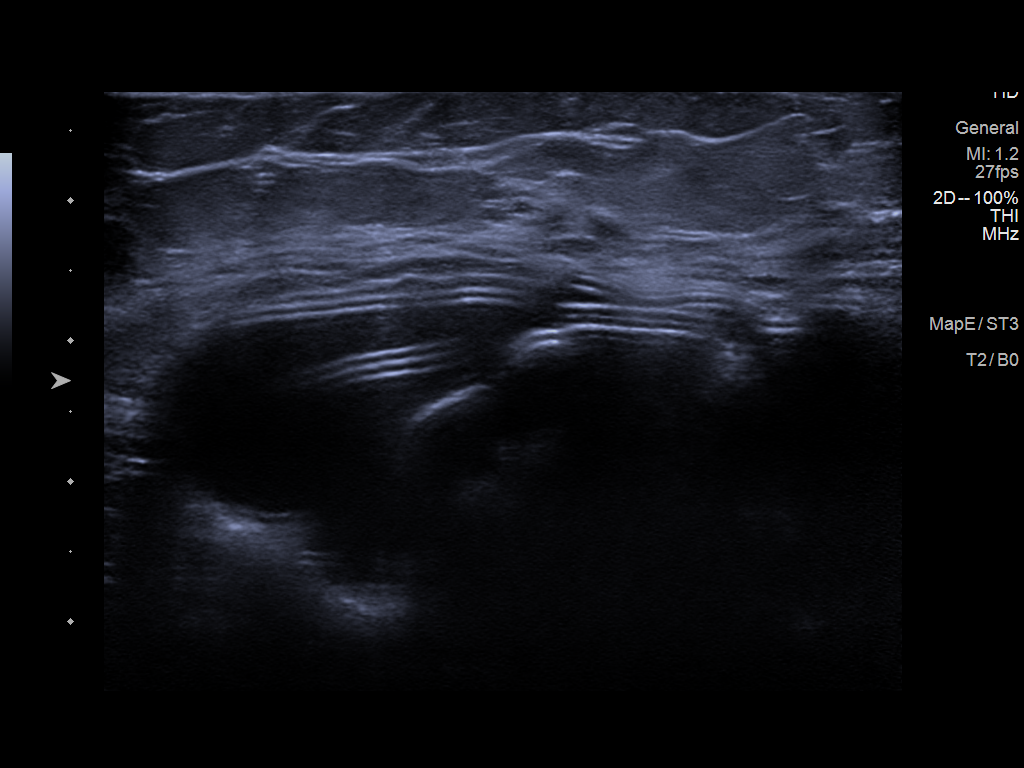
[im 2/7]
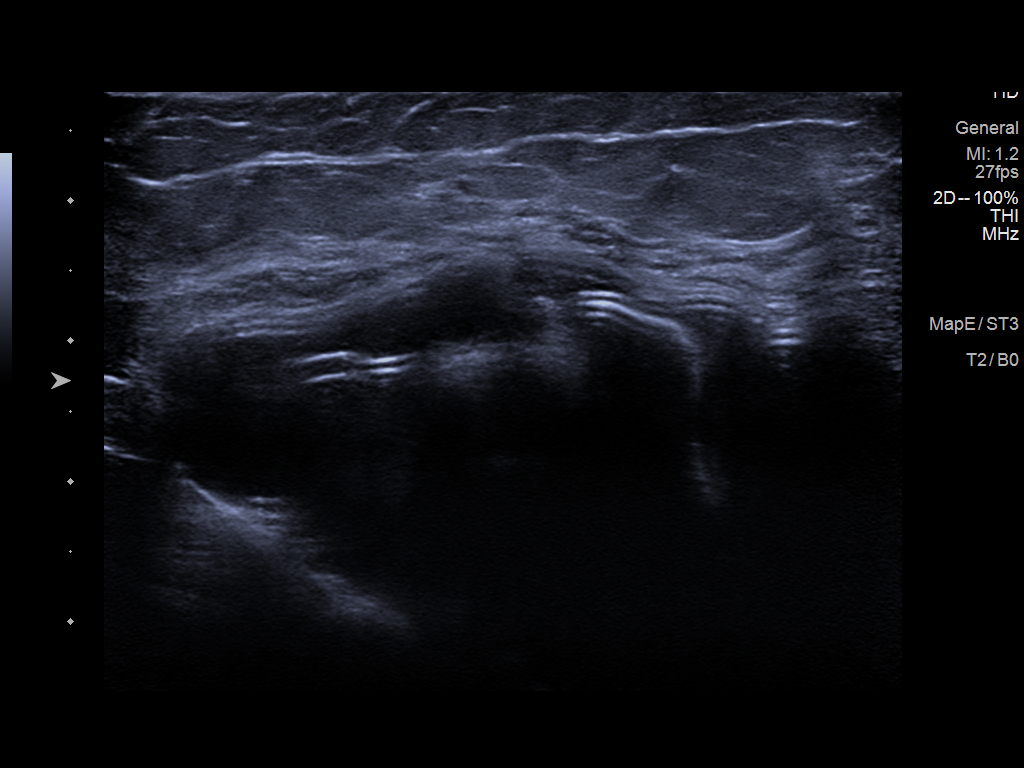
[im 3/7]
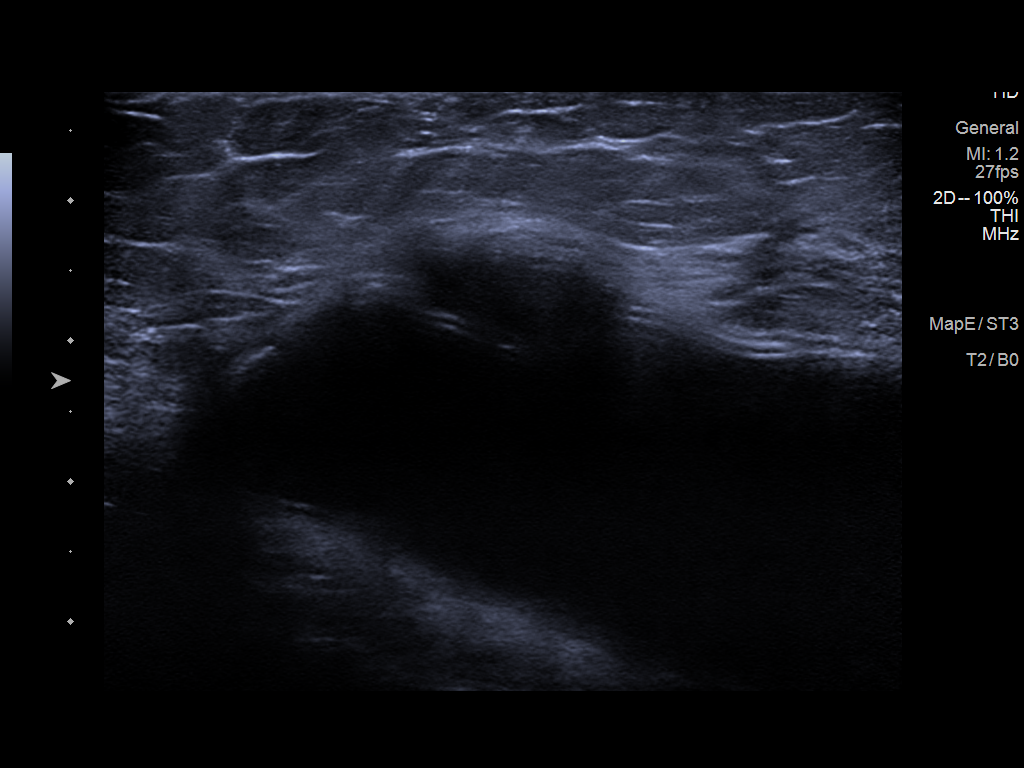
[im 4/7]
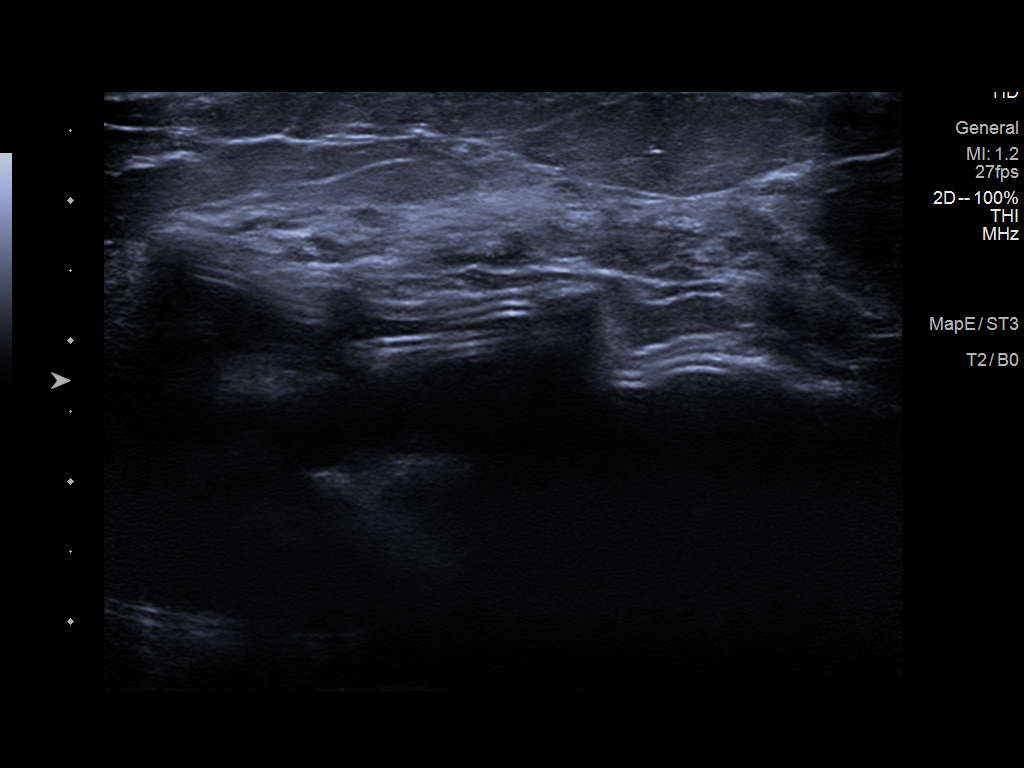
[im 5/7]
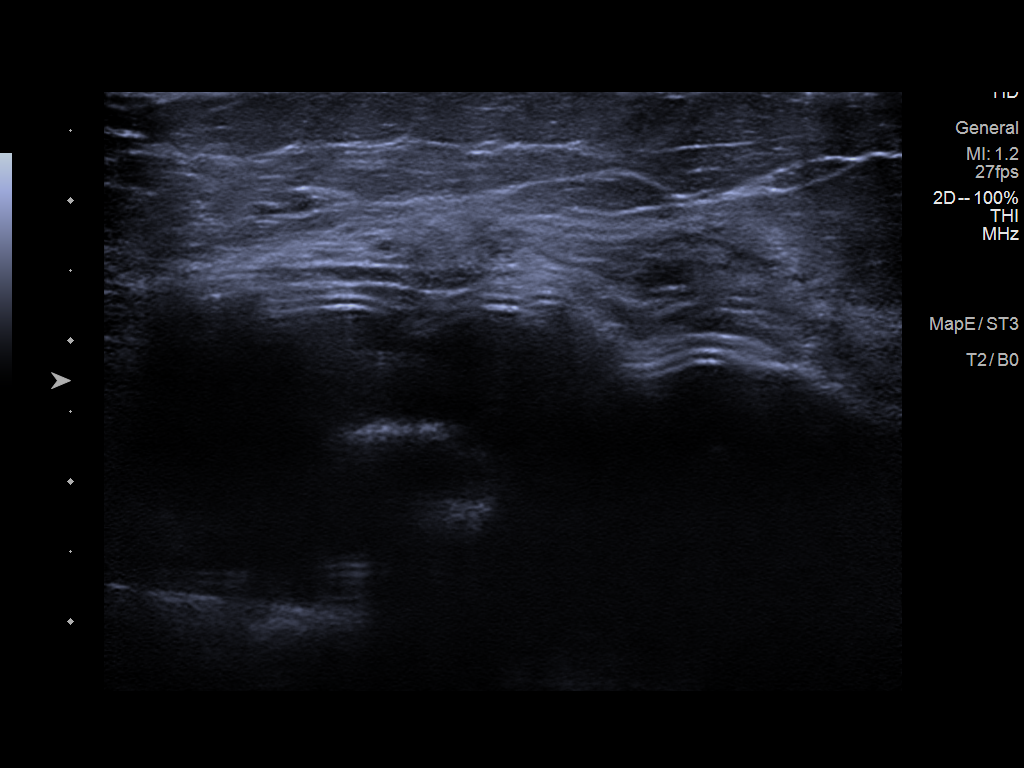
[im 6/7]
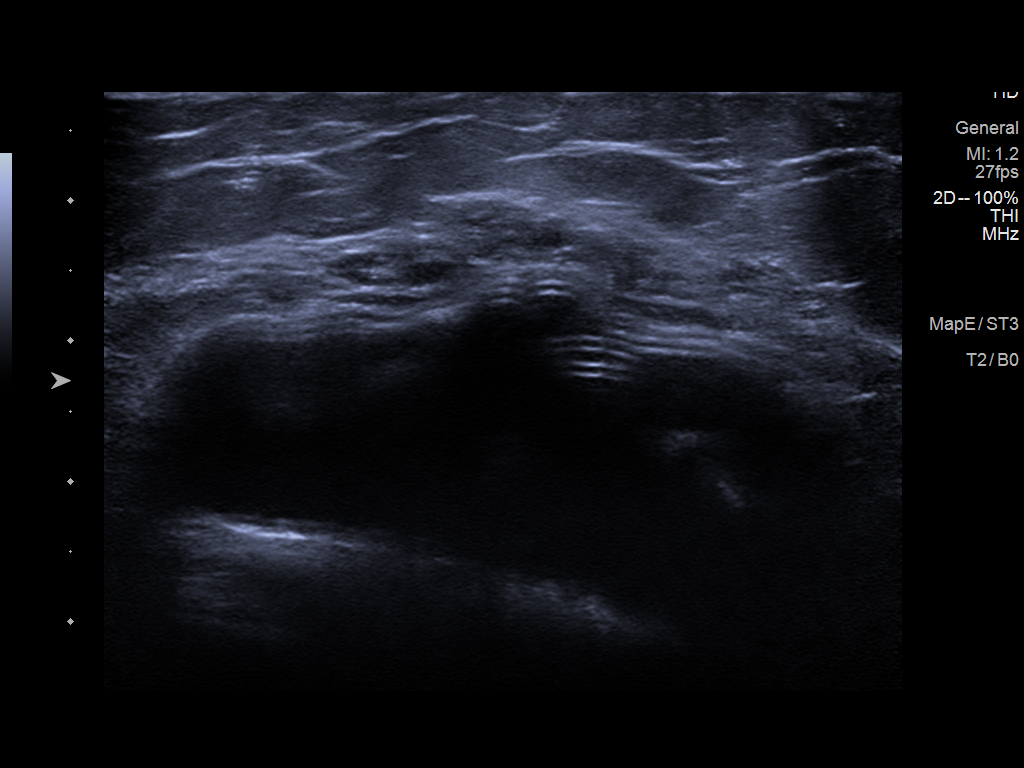
[im 7/7]
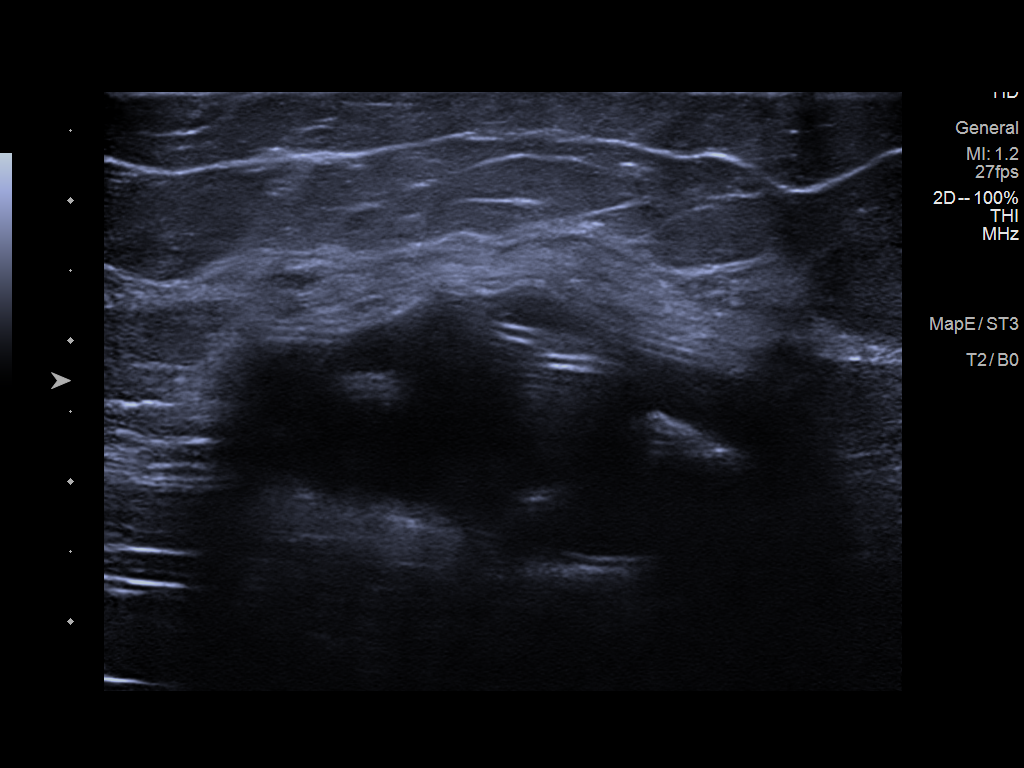

[7 of 7 positions shown; findings below may reference images not displayed]

ACR Breast Density Category c: The breast tissue is heterogeneously
dense, which may obscure small masses.
FINDINGS: No suspicious masses, calcifications, or distortion are seen in
either breast. The patient's left implant appears to be ruptured.
There is also a partially calcified capsule. The patient has
retropectoral implants.

On physical exam, no suspicious lumps are identified.

Targeted ultrasound is performed, showing a partially
collapsed/ruptured left implant. No suspicious masses.
IMPRESSION: Partially collapsed/ruptured left implant.  No suspicious masses.

RECOMMENDATION:
Consider replacement of the patient's left breast implant. Recommend
annual mammography.

I have discussed the findings and recommendations with the patient.
If applicable, a reminder letter will be sent to the patient
regarding the next appointment.

BI-RADS CATEGORY  2: Benign.

## 2022-11-18 ENCOUNTER — Telehealth: Payer: Self-pay | Admitting: Plastic Surgery

## 2022-11-18 NOTE — Telephone Encounter (Signed)
Ready to schedule surgery as soon as possible. Pt checking for quote and surgery date.

## 2022-11-24 ENCOUNTER — Telehealth: Payer: Self-pay

## 2022-11-24 NOTE — Telephone Encounter (Signed)
Faxed for authorization for removal of left ruptured implant and left capsulectomy.

## 2022-12-02 NOTE — Telephone Encounter (Signed)
Pt called to say she did not receive quote in mail. Pt request copy mailed to her again and send email also.  Pt address was confirmed.

## 2022-12-03 ENCOUNTER — Telehealth: Payer: Self-pay

## 2022-12-03 NOTE — Telephone Encounter (Addendum)
Called patient, LMVM insurance approved the removal of left ruptured implant and left capsulectomy ONLY. Inquired if she would like to move forward with BL removal of implants, with replacement BL Capsulectomy, and abdominoplasty.   Called [place], spoke with [place]. Authorization is only good for 30 days for OP SX. Requested 04/08-07/03/2023  Gave file to [employer]

## 2022-12-08 ENCOUNTER — Telehealth: Payer: Self-pay | Admitting: *Deleted

## 2022-12-08 ENCOUNTER — Telehealth: Payer: Self-pay

## 2022-12-08 NOTE — Telephone Encounter (Signed)
Called twice, LMVM if she just wants to have breast surgery, only abdominoplasty, both breast and abdominoplasty surgery

## 2022-12-08 NOTE — Telephone Encounter (Signed)
Updated quote reflecting insurance approval for Left implant removal and capsulectomy mailed to patient and copy sent to batch scan

## 2022-12-22 ENCOUNTER — Telehealth: Payer: Self-pay

## 2022-12-22 ENCOUNTER — Telehealth: Payer: Self-pay | Admitting: Plastic Surgery

## 2022-12-22 NOTE — Telephone Encounter (Signed)
Spoke with Cristal Deer. Ref#[id number] 11:22am with [place] complete. Requested to amend authorization date with eff date 02/21/23. Confirmation with updated authorization date will be faxed to our office

## 2022-12-22 NOTE — Telephone Encounter (Signed)
Would like to speak with schedule about going ahead and getting her surgery scheduled.

## 2022-12-29 ENCOUNTER — Encounter: Payer: Medicaid Other | Admitting: Student

## 2023-01-26 ENCOUNTER — Ambulatory Visit (INDEPENDENT_AMBULATORY_CARE_PROVIDER_SITE_OTHER): Payer: Self-pay | Admitting: Student

## 2023-01-26 ENCOUNTER — Encounter: Payer: Self-pay | Admitting: Internal Medicine

## 2023-01-26 ENCOUNTER — Telehealth: Payer: Self-pay | Admitting: Plastic Surgery

## 2023-01-26 VITALS — BP 134/88 | HR 100

## 2023-01-26 DIAGNOSIS — Z9882 Breast implant status: Secondary | ICD-10-CM

## 2023-01-26 DIAGNOSIS — E65 Localized adiposity: Secondary | ICD-10-CM

## 2023-01-26 MED ORDER — ONDANSETRON HCL 4 MG PO TABS: 4.0000 mg | ORAL_TABLET | Freq: Three times a day (TID) | ORAL | 0 refills | Status: DC | PRN

## 2023-01-26 MED ORDER — OXYCODONE HCL 5 MG PO TABS: 5.0000 mg | ORAL_TABLET | Freq: Four times a day (QID) | ORAL | 0 refills | Status: DC | PRN

## 2023-01-26 NOTE — Telephone Encounter (Signed)
Called 2x and left 2 voicemails that amount paid was only 7600.00 not 8500.00 and that the amount she thought she had extra in her money pouch was actually the other amount owed.

## 2023-01-26 NOTE — Progress Notes (Signed)
Patient ID: Megan Knight, female    DOB: [birth year], 47 y.o.   MRN: [id number]  Chief Complaint  Patient presents with   Pre-op Exam      ICD-10-CM   1. History of bilateral breast implants  Z98.82     2. Localized adiposity of abdomen  E65        History of Present Illness: [name] is a 47 y.o.  female  with a history of breast implants excess skin/tissue to the abdomen.  She presents for preoperative evaluation for upcoming procedure, bilateral implant exchange and abdominoplasty, scheduled for 02/21/2023 with Dr. Ladona Ridgel.  Patient presents to the clinic today for preoperative appointment.  Interpreter is present during the entirety of today's visit.  The patient has not had problems with anesthesia.  Patient denies any personal or family history of breast cancer.  She denies any cardiac issues.  She denies taking any blood thinners.  Patient reports she is not a smoker.  Patient reports she currently has Nexplanon birth control in place.  Patient does report history of 1 miscarriage back in 2012.  Patient denies any personal family history of blood clots or clotting diseases.  She denies any recent surgeries, traumas, infections or hospitalizations.  She denies any history of stroke or heart attack.  She denies any history of inflammatory bowel disease, lung disease or cancer.  She denies any varicosities on her lower extremities.  She denies any recent fevers, chills or changes in her health.  Patient reports that she does not know the current size of her implants, but she would like bigger implants and she would like silicone implants.  Patient states she is thinking she would like 500 cc implants.  I discussed with the patient the limitations of the size of implants that can be placed at the time of surgery and that we cannot guarantee a size for her.  Patient expressed understanding.  Summary of Previous Visit: Patient was initially seen by Dr. Ladona Ridgel for consult on  08/24/2022.  At this visit, patient reported that she had implants placed in 2006 and believes they were 350 cc gel implants.  Patient did not have the documentation she received at the time the implants were placed.  Patient stated that she had gained weight since they replaced and would like larger implants that were more proportion to the size of her body.  Patient is asking for up to 500 cc implants.  On patient's 2 most recent mammograms, the left implant appeared to be ruptured.  Both implants appear to be placed in the submuscular position.  On exam, the right breast looked quite good from an aesthetic standpoint, and the nipples were well-placed with no ptosis.  There was minimal capsular contracture.  The implant base with measured approximately 12-12 and half centimeters.  The left breast was smaller, however the nipple position remained good and well-healed.  Lower incision.  The left breast was visibly smaller than the right.  The patient was also noted to have excess anterior abdominal wall fat and a minimal pannus.  Patient was requesting replacement of her implants and upsizing the implants as well.  The patient also inquired about abdominoplasty or liposuction.  She was not found to be a particularly good candidate for liposuction for contouring of her abdominal wall.  She was an acceptable candidate for abdominoplasty, but would be a much better candidate if she lost some weight first.  Patient was then seen  again by Dr. Ladona Ridgel on 10/28/2022.  Patient stated that she was still interested in removal and replacement of her bilateral breast implants and an abdominoplasty.  Patient reported she was unable to lose any weight and reported that she understood that she will not get an ideal result without the weight loss.  Patient stated she would like an abdominoplasty.  She states that she was unable to lose any more weight and understands that the result may not be as nice as it could potentially be.   Plan was to move forward with surgery per her request.  Job: Works as a Engineer, civil (consulting), states that her schedule is fairly flexible.  I recommended that she take 1 to 2 weeks off after surgery.  I did discuss the postoperative restrictions with the patient.  She expressed understanding.  PMH Significant for: History of implants   Past Medical History: Allergies: No Known Allergies  Current Medications:  Current Outpatient Medications:    diclofenac (VOLTAREN) 50 MG EC tablet, Take 1 tablet (50 mg total) by mouth 2 (two) times daily as needed for moderate pain (knees and hands)., Disp: 60 tablet, Rfl: 2   loratadine (CLARITIN) 10 MG tablet, Take 1 tablet (10 mg total) by mouth daily as needed for allergies., Disp: 90 tablet, Rfl: 1  Past Medical Problems: Past Medical History:  Diagnosis Date   Medical history non-contributory     Past Surgical History: Past Surgical History:  Procedure Laterality Date   AUGMENTATION MAMMAPLASTY     BREAST SURGERY Bilateral    breast augmentation   CESAREAN SECTION     CHOLECYSTECTOMY N/A 11/30/2017   Procedure: LAPAROSCOPIC CHOLECYSTECTOMY;  Surgeon: Ancil Linsey, MD;  Location: [facility] ORS;  Service: General;  Laterality: N/A;   COSMETIC SURGERY  2006    Social History: Social History   Socioeconomic History   Marital status: Divorced    Spouse name: Not on file   Number of children: Not on file   Years of education: Not on file   Highest education level: Not on file  Occupational History   Not on file  Tobacco Use   Smoking status: Never   Smokeless tobacco: Never  Vaping Use   Vaping Use: Never used  Substance and Sexual Activity   Alcohol use: Yes    Comment: rarely < 2 per occasion, less than monthly consumption of alcohol   Drug use: No   Sexual activity: Yes    Birth control/protection: Implant    Comment: mutually monogamous relationship  Other Topics Concern   Not on file  Social History Narrative   Not  on file   Social Determinants of Health   Financial Resource Strain: Not on file  Food Insecurity: Not on file  Transportation Needs: Not on file  Physical Activity: Not on file  Stress: Not on file  Social Connections: Not on file  Intimate Partner Violence: Not on file    Family History: Family History  Problem Relation Age of Onset   Cancer - Colon Neg Hx    Breast cancer Neg Hx     Review of Systems: Denies any fevers or chills  Physical Exam: Vital Signs BP 134/88 (BP Location: Left Arm, Patient Position: Sitting, Cuff Size: Small)   Pulse 100   SpO2 100%   Physical Exam  Constitutional:      General: Not in acute distress.    Appearance: Normal appearance. Not ill-appearing.  HENT:     Head: Normocephalic and atraumatic.  Eyes:     Pupils: Pupils are equal, round Neck:     Musculoskeletal: Normal range of motion.  Cardiovascular:     Rate and Rhythm: Normal rate Pulmonary:     Effort: Pulmonary effort is normal. No respiratory distress.  Abdominal:     General: Abdomen is flat. There is no distension.  Skin:    General: Skin is warm and dry.     Findings: No erythema or rash.  Neurological:     Mental Status: Alert and oriented to person, place, and time. Mental status is at baseline.  Psychiatric:        Mood and Affect: Mood normal.        Behavior: Behavior normal.    Assessment/Plan: The patient is scheduled for bilateral implant exchange and abdominoplasty with Dr. Ladona Ridgel.  Risks, benefits, and alternatives of procedure discussed, questions answered and consent obtained.    Smoking Status: Non-smoker; Counseling Given?  N/A Last Mammogram: 04/15/2022; Results: Prepectoral implants, no finding suspicious for malignancy.  Rupture noted with partial capsular calcification of the left implant.  BI-RADS Category 2: Benign  Caprini Score: 4; Risk Factors include: Age, BMI > 25, and length of planned surgery. Recommendation for mechanical prophylaxis.  Encourage early ambulation.   Pictures obtained: @consult   Post-op Rx sent to pharmacy: Oxycodone, Zofran  Patient was provided with the [employer] implant checklist, National breast implant registry form, and General Surgical Risk consent document and Pain Medication Agreement prior to their appointment.  They had adequate time to read through the risk consent documents and Pain Medication Agreement. We also discussed them in person together during this preop appointment. All of their questions were answered to their satisfaction.  Recommended calling if they have any further questions.  Risk consent form and Pain Medication Agreement to be scanned into patient's chart.  The risk that can be encountered with breast augmentation or maastopexy were discussed and include the following but not limited to these:  Breast asymmetry, fluid accumulation, firmness of the breast, inability to breast feed, loss of nipple or areola, skin loss, decrease or no nipple sensation, fat necrosis of the breast tissue, bleeding, infection, healing delay.  Deep vein thrombosis, cardiac and pulmonary complications are risks to any procedure. The implant can have a faulty position or one different from what you had desired.  The implant can have rippling, wrinkling, leakage or rupture. There are risks of anesthesia, changes to skin sensation and injury to nerves or blood vessels.  The muscle can be temporarily or permanently injured.  You may have an allergic reaction to tape, suture, glue, blood products which can result in skin discoloration, swelling, pain, skin lesions, poor healing.  Any of these can lead to the need for revisonal surgery or stage procedures.  A reduction has potential to interfere with diagnostic procedures.  Nipple or breast piercing can increase risks of infection.    This procedure is best done when the breast is fully developed.  Changes in the breast will continue to occur over time.  Pregnancy can alter  the outcomes of previous breast reduction surgery, weight gain and weigh loss can also effect the long term appearance. Implants are not guaranteed to last a lifetime.  Future surgery may be required.  Regular examinations of the breast are required to evaluate the condition of your breasts and implants.      Electronically signed by: Laurena Spies, PA-C 01/26/2023 2:01 PM

## 2023-02-01 ENCOUNTER — Ambulatory Visit (INDEPENDENT_AMBULATORY_CARE_PROVIDER_SITE_OTHER): Payer: Medicaid Other | Admitting: Internal Medicine

## 2023-02-01 ENCOUNTER — Encounter: Payer: Self-pay | Admitting: Internal Medicine

## 2023-02-01 VITALS — BP 126/80 | HR 87 | Temp 96.8°F | Ht 63.0 in | Wt 161.0 lb

## 2023-02-01 DIAGNOSIS — Z975 Presence of (intrauterine) contraceptive device: Secondary | ICD-10-CM | POA: Diagnosis not present

## 2023-02-01 DIAGNOSIS — Z6828 Body mass index (BMI) 28.0-28.9, adult: Secondary | ICD-10-CM

## 2023-02-01 DIAGNOSIS — E663 Overweight: Secondary | ICD-10-CM

## 2023-02-01 DIAGNOSIS — R7303 Prediabetes: Secondary | ICD-10-CM | POA: Diagnosis not present

## 2023-02-01 DIAGNOSIS — E7800 Pure hypercholesterolemia, unspecified: Secondary | ICD-10-CM | POA: Diagnosis not present

## 2023-02-01 DIAGNOSIS — Z0001 Encounter for general adult medical examination with abnormal findings: Secondary | ICD-10-CM | POA: Diagnosis not present

## 2023-02-01 DIAGNOSIS — Z1231 Encounter for screening mammogram for malignant neoplasm of breast: Secondary | ICD-10-CM

## 2023-02-01 NOTE — Progress Notes (Signed)
Subjective:    Patient ID: Megan Knight, female    DOB: 1976-04-28, 47 y.o.   MRN: [id number]  HPI  Patient presents to clinic today for her annual exam.  Flu: never Tetanus:  unsure COVID: x 2 Pap smear: 04/2018 Mammogram: 03/2022 Colon screening: Never Vision screening: as needed Dentist: biannually  Diet: She does eat meat. She consumes fruits and veggies. She does eat fried foods. She drinks mostly water. Exercise: None  Review of Systems     Past Medical History:  Diagnosis Date   Medical history non-contributory     Current Outpatient Medications  Medication Sig Dispense Refill   diclofenac (VOLTAREN) 50 MG EC tablet Take 1 tablet (50 mg total) by mouth 2 (two) times daily as needed for moderate pain (knees and hands). 60 tablet 2   loratadine (CLARITIN) 10 MG tablet Take 1 tablet (10 mg total) by mouth daily as needed for allergies. 90 tablet 1   ondansetron (ZOFRAN) 4 MG tablet Take 1 tablet (4 mg total) by mouth every 8 (eight) hours as needed for up to 20 doses for nausea or vomiting. 20 tablet 0   oxyCODONE (ROXICODONE) 5 MG immediate release tablet Take 1 tablet (5 mg total) by mouth every 6 (six) hours as needed for up to 20 doses for severe pain. 20 tablet 0   No current facility-administered medications for this visit.    No Known Allergies  Family History  Problem Relation Age of Onset   Cancer - Colon Neg Hx    Breast cancer Neg Hx     Social History   Socioeconomic History   Marital status: Divorced    Spouse name: Not on file   Number of children: Not on file   Years of education: Not on file   Highest education level: Not on file  Occupational History   Not on file  Tobacco Use   Smoking status: Never   Smokeless tobacco: Never  Vaping Use   Vaping Use: Never used  Substance and Sexual Activity   Alcohol use: Yes    Comment: rarely < 2 per occasion, less than monthly consumption of alcohol   Drug use: No   Sexual activity: Yes     Birth control/protection: Implant    Comment: mutually monogamous relationship  Other Topics Concern   Not on file  Social History Narrative   Not on file   Social Determinants of Health   Financial Resource Strain: Not on file  Food Insecurity: Not on file  Transportation Needs: Not on file  Physical Activity: Not on file  Stress: Not on file  Social Connections: Not on file  Intimate Partner Violence: Not on file     Constitutional: Denies fever, malaise, fatigue, headache or abrupt weight changes.  HEENT: Denies eye pain, eye redness, ear pain, ringing in the ears, wax buildup, runny nose, nasal congestion, bloody nose, or sore throat. Respiratory: Denies difficulty breathing, shortness of breath, cough or sputum production.   Cardiovascular: Denies chest pain, chest tightness, palpitations or swelling in the hands or feet.  Gastrointestinal: Denies abdominal pain, bloating, constipation, diarrhea or blood in the stool.  GU: Denies urgency, frequency, pain with urination, burning sensation, blood in urine, odor or discharge. Musculoskeletal: Denies decrease in range of motion, difficulty with gait, muscle pain or joint pain and swelling.  Skin: Denies redness, rashes, lesions or ulcercations.  Neurological: Denies dizziness, difficulty with memory, difficulty with speech or problems with balance and coordination.  Psych: Denies anxiety, depression, SI/HI.  No other specific complaints in a complete review of systems (except as listed in HPI above).  Objective:   Physical Exam  BP 126/80 (BP Location: Left Arm, Patient Position: Sitting, Cuff Size: Normal)   Pulse 87   Temp (!) 96.8 F (36 C) (Temporal)   Ht 5\' 3"  (1.6 m)   Wt 161 lb (73 kg)   SpO2 98%   BMI 28.52 kg/m   Wt Readings from Last 3 Encounters:  08/24/22 145 lb (65.8 kg)  01/21/22 147 lb (66.7 kg)  02/03/21 175 lb 11.2 oz (79.7 kg)    General: Appears her stated age, overweight, in NAD. Skin: Warm,  dry and intact.  HEENT: Head: normal shape and size; Eyes: sclera white, no icterus, conjunctiva pink, PERRLA and EOMs intact;  Neck:  Neck supple, trachea midline. No masses, lumps or thyromegaly present.  Cardiovascular: Normal rate and rhythm. S1,S2 noted.  No murmur, rubs or gallops noted. No JVD or BLE edema. Pulmonary/Chest: Normal effort and positive vesicular breath sounds. No respiratory distress. No wheezes, rales or ronchi noted.  Abdomen: Normal bowel sounds.  Musculoskeletal: Strength 5/5 BUE/BLE. No difficulty with gait.  Neurological: Alert and oriented. Cranial nerves II-XII grossly intact. Coordination normal.  Psychiatric: Mood and affect normal. Behavior is normal. Judgment and thought content normal.     BMET    Component Value Date/Time   NA 139 01/21/2022 1426   K 4.0 01/21/2022 1426   CL 102 01/21/2022 1426   CO2 29 01/21/2022 1426   GLUCOSE 100 (H) 01/21/2022 1426   BUN 9 01/21/2022 1426   CREATININE 0.60 01/21/2022 1426   CALCIUM 9.3 01/21/2022 1426   GFRNONAA >60 01/29/2021 1200   GFRNONAA 94 05/02/2018 1034   GFRAA 109 05/02/2018 1034    Lipid Panel     Component Value Date/Time   CHOL 199 01/21/2022 1426   TRIG 123 01/21/2022 1426   HDL 44 (L) 01/21/2022 1426   CHOLHDL 4.5 01/21/2022 1426   LDLCALC 132 (H) 01/21/2022 1426    CBC    Component Value Date/Time   WBC 8.3 01/21/2022 1426   RBC 4.82 01/21/2022 1426   HGB 14.9 01/21/2022 1426   HCT 45.2 (H) 01/21/2022 1426   PLT 297 01/21/2022 1426   MCV 93.8 01/21/2022 1426   MCH 30.9 01/21/2022 1426   MCHC 33.0 01/21/2022 1426   RDW 12.6 01/21/2022 1426   LYMPHSABS 3,032 05/02/2018 1034   MONOABS 0.7 11/30/2017 0821   EOSABS 1,142 (H) 05/02/2018 1034   BASOSABS 42 05/02/2018 1034    Hgb A1C Lab Results  Component Value Date   HGBA1C 5.7 (H) 01/21/2022            Assessment & Plan:   Preventative Health Maintenance:  Encouraged her to get a flu shot in the fall She  declines tetanus today Encouraged her to get her COVID-vaccine Pap smear due, referral to GYN as she is also needing Nexplanon referral Mammogram ordered-she will call to schedule She declines Referral to GI for screening colonoscopy Encouraged her to consume a balanced diet and exercise regimen Advised her to see an eye doctor and dentist annually We will CBC, c-Met, lipid, A1c today  RTC in 6 months, follow-up chronic conditions Nicki Reaper, NP

## 2023-02-01 NOTE — Assessment & Plan Note (Signed)
Encourage diet and exercise for weight loss 

## 2023-02-01 NOTE — Patient Instructions (Signed)

## 2023-02-02 LAB — LIPID PANEL
Cholesterol: 254 mg/dL — ABNORMAL HIGH (ref <200)
HDL: 55 mg/dL (ref >=50)
LDL Cholesterol (Calc): 156 mg/dL (calc) — ABNORMAL HIGH
Non-HDL Cholesterol (Calc): 199 mg/dL (calc) — ABNORMAL HIGH (ref <130)
Total CHOL/HDL Ratio: 4.6 (calc) (ref <5.0)
Triglycerides: 260 mg/dL — ABNORMAL HIGH (ref <150)

## 2023-02-02 LAB — CBC
HCT: 45.5 % — ABNORMAL HIGH (ref 35.0–45.0)
Hemoglobin: 15.4 g/dL (ref 11.7–15.5)
MCH: 32 pg (ref 27.0–33.0)
MCHC: 33.8 g/dL (ref 32.0–36.0)
MCV: 94.6 fL (ref 80.0–100.0)
MPV: 10.1 fL (ref 7.5–12.5)
Platelets: 329 10*3/uL (ref 140–400)
RBC: 4.81 10*6/uL (ref 3.80–5.10)
RDW: 12.1 % (ref 11.0–15.0)
WBC: 8.1 10*3/uL (ref 3.8–10.8)

## 2023-02-02 LAB — HEMOGLOBIN A1C
Hgb A1c MFr Bld: 6.1 % of total Hgb — ABNORMAL HIGH (ref <5.7)
Mean Plasma Glucose: 128 mg/dL
eAG (mmol/L): 7.1 mmol/L

## 2023-02-02 LAB — COMPLETE METABOLIC PANEL WITH GFR
AG Ratio: 1.3 (calc) (ref 1.0–2.5)
ALT: 12 U/L (ref 6–29)
AST: 14 U/L (ref 10–35)
Albumin: 4.3 g/dL (ref 3.6–5.1)
Alkaline phosphatase (APISO): 50 U/L (ref 31–125)
BUN: 12 mg/dL (ref 7–25)
CO2: 23 mmol/L (ref 20–32)
Calcium: 9.4 mg/dL (ref 8.6–10.2)
Chloride: 103 mmol/L (ref 98–110)
Creat: 0.64 mg/dL (ref 0.50–0.99)
Globulin: 3.3 g/dL (calc) (ref 1.9–3.7)
Glucose, Bld: 77 mg/dL (ref 65–139)
Potassium: 4.6 mmol/L (ref 3.5–5.3)
Sodium: 138 mmol/L (ref 135–146)
Total Bilirubin: 0.3 mg/dL (ref 0.2–1.2)
Total Protein: 7.6 g/dL (ref 6.1–8.1)
eGFR: 110 mL/min/{1.73_m2} (ref >=60)

## 2023-02-03 ENCOUNTER — Telehealth: Payer: Self-pay

## 2023-02-03 NOTE — Telephone Encounter (Signed)
Megan Munroe, NP 02/02/2023  8:01 AM EDT     Blood counts are normal.  Cholesterol and triglycerides remains elevated.  I would strongly encourage cholesterol-lowering medication at this time.  Please let me know if she is agreeable and I will send this in.  She remains prediabetic with an A1c of 6.1, getting closer to the diabetic range.  She should consume a low saturated fat, low carb diet and exercise for weight loss.  Liver and kidney function is normal.    Pt called for lab results. Shared provider's note. Pt will start cholesterol medication. Please send prescription to [facility] in [place].  Pt will stop by office to pick up a copy of lab results.

## 2023-02-04 MED ORDER — ATORVASTATIN CALCIUM 10 MG PO TABS: 10.0000 mg | ORAL_TABLET | Freq: Every day | ORAL | 1 refills | Status: DC

## 2023-02-04 NOTE — Addendum Note (Signed)
Addended by: Lorre Munroe on: 02/04/2023 07:21 AM   Modules accepted: Orders

## 2023-02-16 ENCOUNTER — Telehealth: Payer: Self-pay | Admitting: Surgical

## 2023-02-16 ENCOUNTER — Telehealth: Payer: Self-pay

## 2023-02-16 NOTE — Telephone Encounter (Signed)
Called patient and advised to call Sherrye Payor, RN at [facility] and provided phone number and ext [phone]

## 2023-02-16 NOTE — Telephone Encounter (Signed)
Called patient this afternoon with use of interpreter via telephone to discuss upcoming surgery with Dr. Ladona Ridgel.  Patient did not answer, left voicemail message to please call our office back.  If patient calls back and provider not available, please have nursing staff call interpreter services at [phone] and discussed with patient that if she would like to stay overnight after her surgery at the surgical center of [facility] with Dr. Ladona Ridgel that the cost would be $800 for a total of 23-hour period.  If she would like to stay for 4 to 6 hours after surgery the cost would be $600.  If she decides not to stay after surgery and goes home the same day, we would like her to start a prescription for antibiotics the day of surgery which we can call in for her.

## 2023-02-18 ENCOUNTER — Telehealth: Payer: Self-pay | Admitting: *Deleted

## 2023-02-18 NOTE — Telephone Encounter (Signed)
Received call from [facility] with anesthesia stating she has been unable to reach patient to collect payment for cosmetic surgery. Notes if unable to collect surgery will be cancelled.   LVM for patient.

## 2023-02-21 ENCOUNTER — Other Ambulatory Visit: Payer: Self-pay

## 2023-02-21 DIAGNOSIS — Z411 Encounter for cosmetic surgery: Secondary | ICD-10-CM

## 2023-02-28 ENCOUNTER — Telehealth: Payer: Self-pay | Admitting: Plastic Surgery

## 2023-02-28 ENCOUNTER — Ambulatory Visit (INDEPENDENT_AMBULATORY_CARE_PROVIDER_SITE_OTHER): Payer: Self-pay | Admitting: Plastic Surgery

## 2023-02-28 DIAGNOSIS — Z9886 Personal history of breast implant removal: Secondary | ICD-10-CM

## 2023-02-28 DIAGNOSIS — Z9889 Other specified postprocedural states: Secondary | ICD-10-CM

## 2023-02-28 DIAGNOSIS — Z9882 Breast implant status: Secondary | ICD-10-CM

## 2023-02-28 MED ORDER — CEPHALEXIN 500 MG PO CAPS: 500.0000 mg | ORAL_CAPSULE | Freq: Four times a day (QID) | ORAL | 0 refills | Status: DC

## 2023-02-28 MED ORDER — DOCUSATE SODIUM 100 MG PO CAPS: 100.0000 mg | ORAL_CAPSULE | Freq: Two times a day (BID) | ORAL | 0 refills | Status: DC

## 2023-02-28 MED ORDER — POLYETHYLENE GLYCOL 3350 17 GM/SCOOP PO POWD: 17.0000 g | Freq: Two times a day (BID) | ORAL | 1 refills | Status: DC | PRN

## 2023-02-28 MED ORDER — IBUPROFEN 600 MG PO TABS: 600.0000 mg | ORAL_TABLET | Freq: Three times a day (TID) | ORAL | 0 refills | Status: DC | PRN

## 2023-02-28 NOTE — Progress Notes (Signed)
Ms.Jeune returns today for follow-up after removal of bilateral saline implants, capsulectomies, placement of silicone implants, abdominoplasty.  She states she is doing well but is having difficulty with itching around her incisions and constipation.  She is overall happy with her results.  On examination her breasts are symmetric and have very nice shape and size.  There is erythema along both incisions.  She has erythema along the entire abdominoplasty incision and around the umbilicus.  All of this appears to be secondary to the skin glue.  The dressing on the umbilicus is still in place and was removed the umbilicus is very moist and the skin has become somewhat excoriated and is also irritated where the Dermabond was placed.  Overall good results however I believe she has had a reaction to the Dermabond.  Will place her on antibiotics just to ensure there is no infection.  Miralax for constipation and ibuprofen for discomfort  F/U Friday for possible drain removal

## 2023-02-28 NOTE — Telephone Encounter (Signed)
Pt called asking if her meds were going to be ordered. She stated that she went to her pharm and they were not there. Please advise

## 2023-03-01 ENCOUNTER — Encounter: Payer: Self-pay | Admitting: *Deleted

## 2023-03-01 NOTE — Telephone Encounter (Signed)
Pt called again this morning and wanted to know when her medication was going to be called into the [facility] in [place].  Please call her at [phone]

## 2023-03-02 NOTE — Progress Notes (Signed)
Patient is a pleasant 47 year old female s/p removal of bilateral breast saline implants with silicone implant placement as well as abdominoplasty performed 02/21/2023 by Dr. Ladona Ridgel who presents to clinic for postoperative follow-up.  She was last seen here in clinic on 02/28/2023.  At that time, erythema was noted throughout, but suspected to be related to Dermabond rather than reflective of infection.  She was prescribed Keflex for coverage.  Follow-up scheduled for consideration of drain removal and reevaluation.  No photos were obtained.  Today, patient is accompanied by medical interpreter at bedside.  Left-sided drain output has been minimal, less than 10 cc/day since last encounter.  She is hopeful can be removed given that it is bothersome.  She states that she has some discomfort with coughing and crunches, but otherwise denies any significant pain.  Instead, she endorses considerable pruritus throughout.  She states that she has itching rashes across her abdomen, breasts, and even her upper legs and upper arms.  She states that she has been taking the antibiotics and stool softeners, as prescribed.  She denies any fevers, pain, leg swelling, or other symptoms.  On exam, abdomen is soft, nondistended.  Nontender to palpation.  Normal-appearing output in drain.  Removed without complication or difficulty.  Drain tube insertion site appears clean and healthy.  See pictures.  Considerable erythematous papular rash around the incisions throughout.  Specifically, the abdominoplasty incision, umbilical incision, and inframammary incisions from which the implants were removed and replaced.  She even has a papular rash on the volar aspect of her upper arm.  Rash appears similar to a drug rash, but given that it is predominantly along her incisions, suspect that it could be related to the Dermabond or sutures that were used.  While it is also on her upper arm, patient thinks that it may have been in contact  with her left inframammary area.  She states that her papular rash was present prior to the initiation of antibiotics and stool softeners, for that reason do not need to hold any medication.  Instead, emphasized the importance of Vaseline to her incisions throughout to help remove the residual Dermabond which could be contributing to her rash and generalized pruritus.  Her rashes are not suspicious for infection and instead reflective of irritant or allergic dermatitis.  Would consider steroids, but would need to be systemic given how widespread her rashes and that would impair wound healing.  Instead, recommending twice daily antihistamines in addition to the Vaseline.  Follow-up as scheduled, but she can certainly call the clinic should she have any new or worsening symptoms in the interim.  Informed patient that we would be happy to see her again next week if she felt as though the rash was worsening or failing to improve.  Suspect that it will get better with continued time, Vaseline, and and histamines.  Picture(s) obtained of the patient and placed in the chart were with the patient's or guardian's permission.

## 2023-03-02 NOTE — Telephone Encounter (Signed)
I put her meds in Monday. Not sure which pharmacy

## 2023-03-04 ENCOUNTER — Ambulatory Visit (INDEPENDENT_AMBULATORY_CARE_PROVIDER_SITE_OTHER): Payer: Self-pay | Admitting: Physician Assistant

## 2023-03-04 DIAGNOSIS — Z9886 Personal history of breast implant removal: Secondary | ICD-10-CM

## 2023-03-04 DIAGNOSIS — Z9889 Other specified postprocedural states: Secondary | ICD-10-CM

## 2023-03-17 ENCOUNTER — Encounter: Payer: Self-pay | Admitting: Student

## 2023-03-17 ENCOUNTER — Ambulatory Visit (INDEPENDENT_AMBULATORY_CARE_PROVIDER_SITE_OTHER): Payer: Self-pay | Admitting: Student

## 2023-03-17 VITALS — BP 107/69 | HR 94

## 2023-03-17 DIAGNOSIS — Z9889 Other specified postprocedural states: Secondary | ICD-10-CM

## 2023-03-17 DIAGNOSIS — Z9882 Breast implant status: Secondary | ICD-10-CM

## 2023-03-17 DIAGNOSIS — Z9886 Personal history of breast implant removal: Secondary | ICD-10-CM

## 2023-03-17 DIAGNOSIS — Z719 Counseling, unspecified: Secondary | ICD-10-CM

## 2023-03-17 NOTE — Progress Notes (Signed)
Patient is a 47 year old female who underwent removal of bilateral breast saline implants with silicone implant placement as well as abdominoplasty performed on 02/21/2023 by Dr. Ladona Ridgel.  She is about 3 and half weeks postop.  She presents to the clinic today for postoperative follow-up.  Patient was last seen in the clinic on 03/04/2023.  At this visit, her left drain output had been minimal since her previous encounter.  She did report itching rashes across her abdomen breasts and her upper legs and arms.  On exam, her abdomen was soft and nondistended.  It is nontender to palpation.  Her JP drain was removed without difficulty.  There was a considerable erythematous papular rash around the incision throughout.  It was believed Dermabond or sutures may have been causing her rash.  It was recommended to the patient that she apply Vaseline to her incisions to help remove residual Dermabond.  Antihistamines were also recommended in addition to the Vaseline.  Interpreter present at bedside.  Today, patient reports she is doing well.  She states that she still having some itching, more so related to the heat and when she is wearing tighter clothing.  She states that she has been taking antihistamines and applying Vaseline to her incisions.  She denies any fevers or chills.  She denies any drainage from her incisions.  Chaperone present on exam.  On exam, patient is sitting upright in no acute distress.  Abdomen is soft and nontender.  There is no overlying erythema.  Umbilicus appears viable.  Incision appears to be intact around the umbilicus.  Sutures were noted to the umbilicus.  These were cut and removed.  Patient tolerated well.  Inferior abdominal incision is intact.  There are several suture knots noted throughout the incision with some surrounding irritation.  These were cut and removed.  Patient tolerated well.  It appears all the residual Dermabond has been removed, there is still some mild irritation to  the skin but appears much improved from photo from previous exam.  Breasts are soft and symmetric.  They have good shape.  NAC's appear to be viable bilaterally.  Incisions are intact and healing well bilaterally.  There is a suture knot noted to the left inframammary incision with some mild surrounding irritation.  This was cut removed.  Patient tolerated well.  There are no signs of infection on exam.  Discussed with the patient that I would like her to continue to apply Vaseline to her incisions daily.  Discussed with her she may also use a moisturizing cream such as Cetaphil or CeraVe if she would like for the skin as well to help with the itching.  I also discussed with her she may gently massage her incisions as she is applying Vaseline.  Patient expressed understanding.  I discussed with the patient she may transition to scar creams in about 1 to 2 weeks.  We discussed the use of silicone based scar creams.  Patient expressed understanding.  Discussed with patient would like her to continue compression.  I also discussed with her would like her to continue to take antihistamines as needed for itching.  Patient expressed understanding.  I would like the patient to come back in 2 weeks   I instructed the patient to call in the meantime she has any questions or concerns about anything.

## 2023-03-28 ENCOUNTER — Ambulatory Visit (INDEPENDENT_AMBULATORY_CARE_PROVIDER_SITE_OTHER): Payer: Self-pay | Admitting: Student

## 2023-03-28 VITALS — BP 115/77 | HR 88

## 2023-03-28 DIAGNOSIS — Z9886 Personal history of breast implant removal: Secondary | ICD-10-CM

## 2023-03-28 NOTE — Progress Notes (Signed)
Patient is a 47 year old female who underwent removal of bilateral breast saline implants with silicone implant placement as well as abdominoplasty performed on 02/21/2023 by Dr. Ladona Ridgel.  She is about 5 weeks postop.  She presents to the clinic today for postoperative follow-up     Patient was last seen in the clinic on 03/17/2023.  At this visit, patient reports she is doing well.  She stated that she was having some itching and was taking some antihistamines as well as applying Vaseline to her incisions.  On exam, her abdomen was soft and nontender.  No overlying erythema.  Umbilicus appeared to be viable.  Incisions were intact.  There was some mild irritation to the skin, but appear to be improving.  Incisions to the breasts were intact and healing well also.  Plan is for patient to apply Vaseline to her incisions daily, and moisturizer to the skin.  Discussed that she may gently massage her incisions.   Today, patient reports he is doing well.  There is interpreter present at bedside.  Patient states that her itching has improved.  She states that she still gets it from time to time.  She states that she is still taking Benadryl at night to avoid the itching.  She states she also has been applying Vaseline to her incisions.  She denies any other issues or concerns.  She is overall happy with her result.  Chaperone present on exam.  On exam, patient is sitting upright in no acute distress.  Breasts are soft and symmetric.  They have good shape.  There is no overlying erythema.  There are no fluid collections palpated on exam.  NAC's are healthy bilaterally.  Incisions are intact and healing well.  No signs of infection on exam.  Abdomen is soft and nontender.  There is no overlying erythema.  No fluid collections palpated on exam.  Umbilicus appears to be healthy.  Incision appears to be well-healed.  Lower abdominal incision appears to be healing well.  There are some areas of firmness that are  consistent with some scar tissue.  No signs of infection on exam.  A little bit of pigment change around the incisions.  No signs of infection on exam.  Recommended to the patient that she should take an antihistamine such as Claritin, Zyrtec or Allegra for her itching during the day as this will not make her drowsy.  Discussed with her to only take oral medication if absolutely needed.  Discussed with her in the meantime she should apply lotion or moisturizer to the skin to help with itching.  Discussed with her that she may transition to scar creams on the incisions if she would like.  Patient expressed understanding.  Discussed with patient that as of next week, she no longer has to wear compression and may start gradually increasing her exercises and activities.  Patient expressed understanding.  I would like the patient to follow back up in 2 to 3 weeks to ensure that she continues to improve with her itching.  I instructed the patient to call in the meantime if she has any questions or concerns.  Pictures were obtained of the patient and placed in the chart with the patient's or guardian's permission.

## 2023-03-31 ENCOUNTER — Encounter: Payer: Medicaid Other | Admitting: Student

## 2023-04-05 LAB — RESULTS CONSOLE HPV: CHL HPV: NEGATIVE

## 2023-04-05 LAB — HM PAP SMEAR: HM Pap smear: NEGATIVE

## 2023-04-18 ENCOUNTER — Ambulatory Visit (INDEPENDENT_AMBULATORY_CARE_PROVIDER_SITE_OTHER): Payer: Medicaid Other | Admitting: Student

## 2023-04-18 DIAGNOSIS — Z9889 Other specified postprocedural states: Secondary | ICD-10-CM

## 2023-04-18 DIAGNOSIS — Z9886 Personal history of breast implant removal: Secondary | ICD-10-CM

## 2023-04-18 DIAGNOSIS — Z9882 Breast implant status: Secondary | ICD-10-CM

## 2023-04-18 NOTE — Progress Notes (Signed)
Patient is a 48 year old female who underwent removal of bilateral breast saline implants with silicone implant placement as well as abdominoplasty performed on 02/21/2023 by Dr. Ladona Ridgel.  She is about 8 weeks postop.  She presents to the clinic today for postoperative follow-up         Patient was last seen in the clinic on 03/28/2023.  At this visit, patient was doing well.  She states that she was still experiencing some itching and was taking Benadryl at night.  On exam, incisions were healing nicely.  There was some pigment change around the incisions.  Plan was patient to follow back up in 2 to 3 weeks.  Today, interpreter is present at bedside.  Patient reports she is doing well.  She states that her itching has been improving, and she has stopped taking Benadryl at night and has just been taking antihistamine such as Claritin, Zyrtec or Allegra during the day.  Patient reports she has been using scar creams.  She denies any other issues or concerns.  She states overall she is still happy with her result.  Chaperone present on exam.  On exam, patient is sitting upright in no acute distress.  Breasts are soft and symmetric.  They have good shape.  There is no overlying erythema.  No fluid collections on exam.  Incisions are healing nicely bilaterally.  No signs of infection on exam.  Abdomen is soft and nontender.  Incisions to the umbilicus and lower abdominal incision are healing well.  There is some areas of firmness underneath the lower abdominal incision that appear to be consistent with some scarring versus possibly suture underneath the skin.  There is some pigmentation changes around the umbilical and lower abdominal incisions.  There are no signs of infection on exam.  Discussed with patient that I would like her to continue to massage her incisions and to apply scar creams.  Discussed with her to continue to try to wean herself off of the antihistamines as much as she can, especially as it  seems her symptoms are improving.  Patient expressed understanding.  If pigmentation around the incisions still persists, we may consider treating it with laser to see if this may help with the pigment.  We will consider this later on though.  Patient to follow-up in 6 months and in 1 year for reevaluation.  Discussed with her to call in the meantime she has any questions or concerns about anything.  Pictures were obtained of the patient and placed in the chart with the patient's or guardian's permission.   Dr. Ladona Ridgel was also able to examine the patient today and was in agreement with the plan.

## 2023-04-22 ENCOUNTER — Ambulatory Visit
Admission: RE | Admit: 2023-04-22 | Discharge: 2023-04-22 | Disposition: A | Discharge status: Home / Self Care | Payer: Medicaid Other | Source: Ambulatory Visit | Attending: Internal Medicine | Admitting: Internal Medicine

## 2023-04-22 DIAGNOSIS — Z1231 Encounter for screening mammogram for malignant neoplasm of breast: Secondary | ICD-10-CM | POA: Diagnosis present

## 2023-05-09 ENCOUNTER — Other Ambulatory Visit: Payer: Medicaid Other

## 2023-05-09 DIAGNOSIS — E7800 Pure hypercholesterolemia, unspecified: Secondary | ICD-10-CM

## 2023-05-10 LAB — LIPID PANEL
Cholesterol: 171 mg/dL (ref <200)
HDL: 49 mg/dL — ABNORMAL LOW (ref >=50)
LDL Cholesterol (Calc): 100 mg/dL — ABNORMAL HIGH
Non-HDL Cholesterol (Calc): 122 mg/dL (ref <130)
Total CHOL/HDL Ratio: 3.5 (calc) (ref <5.0)
Triglycerides: 128 mg/dL (ref <150)

## 2023-05-10 LAB — COMPLETE METABOLIC PANEL WITH GFR
AG Ratio: 1.5 (calc) (ref 1.0–2.5)
ALT: 11 U/L (ref 6–29)
AST: 12 U/L (ref 10–35)
Albumin: 4.3 g/dL (ref 3.6–5.1)
Alkaline phosphatase (APISO): 44 U/L (ref 31–125)
BUN: 10 mg/dL (ref 7–25)
CO2: 28 mmol/L (ref 20–32)
Calcium: 9.9 mg/dL (ref 8.6–10.2)
Chloride: 104 mmol/L (ref 98–110)
Creat: 0.63 mg/dL (ref 0.50–0.99)
Globulin: 2.8 g/dL (ref 1.9–3.7)
Glucose, Bld: 86 mg/dL (ref 65–99)
Potassium: 4.4 mmol/L (ref 3.5–5.3)
Sodium: 140 mmol/L (ref 135–146)
Total Bilirubin: 0.3 mg/dL (ref 0.2–1.2)
Total Protein: 7.1 g/dL (ref 6.1–8.1)
eGFR: 110 mL/min/{1.73_m2} (ref >=60)

## 2023-08-15 ENCOUNTER — Other Ambulatory Visit: Payer: Self-pay | Admitting: Internal Medicine

## 2023-08-15 NOTE — Telephone Encounter (Signed)
Medication Refill -  Most Recent Primary Care Visit:  Provider: [facility]  Department: [facility]  Visit Type: LAB  Date: 05/09/2023  Medication: atorvastatin (LIPITOR) 10 MG tablet [[id number]   Has the patient contacted their pharmacy? No ( Is this the correct pharmacy for this prescription? Yes If no, delete pharmacy and type the correct one.  This is the patient's preferred pharmacy:  [id number] - [place], [place] - [address] AT [facility] OF [address] [place] [address] [place] [address] Phone: [phone] Fax: [phone]   Has the prescription been filled recently? Yes  Is the patient out of the medication? Yes  Has the patient been seen for an appointment in the last year OR does the patient have an upcoming appointment? Yes  Can we respond through MyChart? Yes  Agent: Please be advised that Rx refills may take up to 3 business days. We ask that you follow-up with your pharmacy.

## 2023-08-16 MED ORDER — ATORVASTATIN CALCIUM 10 MG PO TABS: 10.0000 mg | ORAL_TABLET | Freq: Every day | ORAL | 1 refills | Status: DC

## 2023-08-16 NOTE — Telephone Encounter (Signed)
Requested Prescriptions  Pending Prescriptions Disp Refills   atorvastatin (LIPITOR) 10 MG tablet 90 tablet 1    Sig: Take 1 tablet (10 mg total) by mouth daily.     Cardiovascular:  Antilipid - Statins Failed - 08/16/2023 10:42 AM      Failed - Lipid Panel in normal range within the last 12 months    Cholesterol  Date Value Ref Range Status  05/09/2023 171 <200 mg/dL Final   LDL Cholesterol (Calc)  Date Value Ref Range Status  05/09/2023 100 (H) mg/dL (calc) Final    Comment:    Reference range: <100 . Desirable range <100 mg/dL for primary prevention;   <70 mg/dL for patients with CHD or diabetic patients  with > or = 2 CHD risk factors. Marland Kitchen LDL-C is now calculated using the Martin-Hopkins  calculation, which is a validated novel method providing  better accuracy than the Friedewald equation in the  estimation of LDL-C.  Horald Pollen et al. Lenox Ahr. 1610;960(45): 2061-2068  (http://education.[facility].com/faq/FAQ164)    HDL  Date Value Ref Range Status  05/09/2023 49 (L) > OR = 50 mg/dL Final   Triglycerides  Date Value Ref Range Status  05/09/2023 128 <150 mg/dL Final         Passed - Patient is not pregnant      Passed - Valid encounter within last 12 months    Recent Outpatient Visits           6 months ago Encounter for general adult medical examination with abnormal findings   [place] [facility] [place], Salvadore Oxford, NP   1 year ago Encounter for general adult medical examination with abnormal findings   [place] [facility] [place], Salvadore Oxford, NP   3 years ago Encounter for annual physical exam   [place] [facility], Jodelle Gross, FNP   5 years ago Encounter for annual physical exam   [place] [facility] [place], Alison Stalling, NP   5 years ago Carpal tunnel syndrome of left wrist   [place] [facility] Kyung Rudd, Alison Stalling, NP       Future Appointments              In 5 months Baity, Salvadore Oxford, NP  [facility], [facility]

## 2023-10-19 ENCOUNTER — Ambulatory Visit: Payer: Medicaid Other | Admitting: Plastic Surgery

## 2023-10-31 ENCOUNTER — Ambulatory Visit (INDEPENDENT_AMBULATORY_CARE_PROVIDER_SITE_OTHER): Payer: Self-pay | Admitting: Plastic Surgery

## 2023-10-31 ENCOUNTER — Encounter: Payer: Self-pay | Admitting: Plastic Surgery

## 2023-10-31 VITALS — BP 132/84 | HR 78

## 2023-10-31 DIAGNOSIS — Z9889 Other specified postprocedural states: Secondary | ICD-10-CM

## 2023-10-31 DIAGNOSIS — Z9882 Breast implant status: Secondary | ICD-10-CM

## 2023-10-31 NOTE — Progress Notes (Signed)
 Ms Megan Knight returns today 6 months postop from an abdominoplasty and breast augmentation.  She is doing extremely well.  She has no specific complaints.  On physical examination all incisions are clean dry and intact and well-healed.  There is no evidence of keloid or hypertrophic scars.  She has an outstanding shape and size.  The skin discoloration from the Dermabond has completely resolved.  Status post breast augmentation and abdominoplasty.  Doing well.  All activity as tolerated.  Will follow-up with me at the 1 year point.  Photographs obtained with her consent.

## 2024-02-06 ENCOUNTER — Encounter: Payer: Self-pay | Admitting: Internal Medicine

## 2024-02-18 ENCOUNTER — Other Ambulatory Visit: Payer: Self-pay | Admitting: Internal Medicine

## 2024-02-21 ENCOUNTER — Encounter: Payer: Self-pay | Admitting: Internal Medicine

## 2024-02-21 ENCOUNTER — Ambulatory Visit (INDEPENDENT_AMBULATORY_CARE_PROVIDER_SITE_OTHER): Admitting: Internal Medicine

## 2024-02-21 VITALS — BP 122/78 | Ht 63.0 in | Wt 158.4 lb

## 2024-02-21 DIAGNOSIS — E7800 Pure hypercholesterolemia, unspecified: Secondary | ICD-10-CM

## 2024-02-21 DIAGNOSIS — Z1231 Encounter for screening mammogram for malignant neoplasm of breast: Secondary | ICD-10-CM

## 2024-02-21 DIAGNOSIS — Z0001 Encounter for general adult medical examination with abnormal findings: Secondary | ICD-10-CM | POA: Diagnosis not present

## 2024-02-21 DIAGNOSIS — R7303 Prediabetes: Secondary | ICD-10-CM

## 2024-02-21 DIAGNOSIS — Z1211 Encounter for screening for malignant neoplasm of colon: Secondary | ICD-10-CM | POA: Diagnosis not present

## 2024-02-21 DIAGNOSIS — Z124 Encounter for screening for malignant neoplasm of cervix: Secondary | ICD-10-CM

## 2024-02-21 DIAGNOSIS — E663 Overweight: Secondary | ICD-10-CM

## 2024-02-21 DIAGNOSIS — Z6828 Body mass index (BMI) 28.0-28.9, adult: Secondary | ICD-10-CM

## 2024-02-21 NOTE — Patient Instructions (Signed)

## 2024-02-21 NOTE — Assessment & Plan Note (Signed)
 Encourage diet and exercise for weight loss

## 2024-02-21 NOTE — Progress Notes (Signed)
 Subjective:    Patient ID: Megan Knight, female    DOB: Jun 07, 1976, 48 y.o.   MRN: [id number]  HPI  Patient presents to clinic today for her annual exam.  Flu: never Tetanus: unsure COVID: x 2 Pap smear: 2024 Mammogram: 03/2023 Colon screening: Never Vision screening: as needed Dentist: biannually  Diet: She does eat meat. She consumes fruits and veggies. She does eat fried foods. She drinks mostly water. Exercise: None  Review of Systems     Past Medical History:  Diagnosis Date   Medical history non-contributory     Current Outpatient Medications  Medication Sig Dispense Refill   atorvastatin  (LIPITOR) 10 MG tablet Take 1 tablet (10 mg total) by mouth daily. 90 tablet 1   cephALEXin  (KEFLEX ) 500 MG capsule Take 1 capsule (500 mg total) by mouth 4 (four) times daily. 28 capsule 0   diclofenac  (VOLTAREN ) 50 MG EC tablet Take 1 tablet (50 mg total) by mouth 2 (two) times daily as needed for moderate pain (knees and hands). 60 tablet 2   docusate sodium  (COLACE) 100 MG capsule Take 1 capsule (100 mg total) by mouth 2 (two) times daily. 10 capsule 0   ibuprofen  (ADVIL ) 600 MG tablet Take 1 tablet (600 mg total) by mouth every 8 (eight) hours as needed. 30 tablet 0   loratadine  (CLARITIN ) 10 MG tablet Take 1 tablet (10 mg total) by mouth daily as needed for allergies. 90 tablet 1   ondansetron  (ZOFRAN ) 4 MG tablet Take 1 tablet (4 mg total) by mouth every 8 (eight) hours as needed for up to 20 doses for nausea or vomiting. (Patient not taking: Reported on 03/28/2023) 20 tablet 0   oxyCODONE  (ROXICODONE ) 5 MG immediate release tablet Take 1 tablet (5 mg total) by mouth every 6 (six) hours as needed for up to 20 doses for severe pain. (Patient not taking: Reported on 03/28/2023) 20 tablet 0   polyethylene glycol powder (GLYCOLAX /MIRALAX ) 17 GM/SCOOP powder Take 17 g by mouth 2 (two) times daily as needed. 3350 g 1   No current facility-administered medications for this visit.     No Known Allergies  Family History  Problem Relation Age of Onset   Cancer - Colon Neg Hx    Breast cancer Neg Hx     Social History   Socioeconomic History   Marital status: Divorced    Spouse name: Not on file   Number of children: Not on file   Years of education: Not on file   Highest education level: Not on file  Occupational History   Not on file  Tobacco Use   Smoking status: Never   Smokeless tobacco: Never  Vaping Use   Vaping status: Never Used  Substance and Sexual Activity   Alcohol use: Yes    Comment: rarely < 2 per occasion, less than monthly consumption of alcohol   Drug use: No   Sexual activity: Yes    Birth control/protection: Implant    Comment: mutually monogamous relationship  Other Topics Concern   Not on file  Social History Narrative   Not on file   Social Drivers of Health   Financial Resource Strain: Not on file  Food Insecurity: Not on file  Transportation Needs: Not on file  Physical Activity: Not on file  Stress: Not on file  Social Connections: Not on file  Intimate Partner Violence: Not on file     Constitutional: Denies fever, malaise, fatigue, headache or abrupt weight changes.  HEENT: Denies eye pain, eye redness, ear pain, ringing in the ears, wax buildup, runny nose, nasal congestion, bloody nose, or sore throat. Respiratory: Denies difficulty breathing, shortness of breath, cough or sputum production.   Cardiovascular: Denies chest pain, chest tightness, palpitations or swelling in the hands or feet.  Gastrointestinal: Denies abdominal pain, bloating, constipation, diarrhea or blood in the stool.  GU: Denies urgency, frequency, pain with urination, burning sensation, blood in urine, odor or discharge. Musculoskeletal: Denies decrease in range of motion, difficulty with gait, muscle pain or joint pain and swelling.  Skin: Denies redness, rashes, lesions or ulcercations.  Neurological: Denies dizziness, difficulty with  memory, difficulty with speech or problems with balance and coordination.  Psych: Denies anxiety, depression, SI/HI.  No other specific complaints in a complete review of systems (except as listed in HPI above).  Objective:   Physical Exam BP 122/78 (BP Location: Left Arm, Patient Position: Sitting, Cuff Size: Normal)   Ht 5' 3 (1.6 m)   Wt 158 lb 6.4 oz (71.8 kg)   LMP 02/13/2024 (Approximate)   BMI 28.06 kg/m    Wt Readings from Last 3 Encounters:  02/01/23 161 lb (73 kg)  08/24/22 145 lb (65.8 kg)  01/21/22 147 lb (66.7 kg)    General: Appears her stated age, overweight, in NAD. Skin: Warm, dry and intact.  HEENT: Head: normal shape and size; Eyes: sclera white, no icterus, conjunctiva pink, PERRLA and EOMs intact;  Neck:  Neck supple, trachea midline. No masses, lumps or thyromegaly present.  Cardiovascular: Normal rate and rhythm. S1,S2 noted.  No murmur, rubs or gallops noted. No JVD or BLE edema. Pulmonary/Chest: Normal effort and positive vesicular breath sounds. No respiratory distress. No wheezes, rales or ronchi noted.  Abdomen: Normal bowel sounds.  Musculoskeletal: Bunions noted of bilateral feet. Strength 5/5 BUE/BLE. No difficulty with gait.  Neurological: Alert and oriented. Cranial nerves II-XII grossly intact. Coordination normal.  Psychiatric: Mood and affect normal. Behavior is normal. Judgment and thought content normal.     BMET    Component Value Date/Time   NA 140 05/09/2023 0835   K 4.4 05/09/2023 0835   CL 104 05/09/2023 0835   CO2 28 05/09/2023 0835   GLUCOSE 86 05/09/2023 0835   BUN 10 05/09/2023 0835   CREATININE 0.63 05/09/2023 0835   CALCIUM  9.9 05/09/2023 0835   GFRNONAA >60 01/29/2021 1200   GFRNONAA 94 05/02/2018 1034   GFRAA 109 05/02/2018 1034    Lipid Panel     Component Value Date/Time   CHOL 171 05/09/2023 0835   TRIG 128 05/09/2023 0835   HDL 49 (L) 05/09/2023 0835   CHOLHDL 3.5 05/09/2023 0835   LDLCALC 100 (H)  05/09/2023 0835    CBC    Component Value Date/Time   WBC 8.1 02/01/2023 1504   RBC 4.81 02/01/2023 1504   HGB 15.4 02/01/2023 1504   HCT 45.5 (H) 02/01/2023 1504   PLT 329 02/01/2023 1504   MCV 94.6 02/01/2023 1504   MCH 32.0 02/01/2023 1504   MCHC 33.8 02/01/2023 1504   RDW 12.1 02/01/2023 1504   LYMPHSABS 3,032 05/02/2018 1034   MONOABS 0.7 11/30/2017 0821   EOSABS 1,142 (H) 05/02/2018 1034   BASOSABS 42 05/02/2018 1034    Hgb A1C Lab Results  Component Value Date   HGBA1C 6.1 (H) 02/01/2023            Assessment & Plan:   Preventative Health Maintenance:  Encouraged her to get a flu shot  in the fall She declines tetanus today Encouraged her to get her COVID-vaccine Pap smear UTD, will request copy Mammogram ordered-she will call to schedule Referral to GI for screening colonoscopy Encouraged her to consume a balanced diet and exercise regimen Advised her to see an eye doctor and dentist annually We will CBC, c-Met, lipid, A1c today  RTC in 6 months, follow-up chronic conditions Angeline Laura, NP

## 2024-02-21 NOTE — Telephone Encounter (Signed)
 Requested Prescriptions  Pending Prescriptions Disp Refills   atorvastatin  (LIPITOR) 10 MG tablet [Pharmacy Med Name: ATORVASTATIN  10MG  TABLETS] 90 tablet 0    Sig: TAKE 1 TABLET(10 MG) BY MOUTH DAILY     Cardiovascular:  Antilipid - Statins Failed - 02/21/2024 12:45 PM      Failed - Valid encounter within last 12 months    Recent Outpatient Visits           Today Encounter for general adult medical examination with abnormal findings   [place] [facility] [place], [place], NP              Failed - Lipid Panel in normal range within the last 12 months    Cholesterol  Date Value Ref Range Status  05/09/2023 171 <200 mg/dL Final   LDL Cholesterol (Calc)  Date Value Ref Range Status  05/09/2023 100 (H) mg/dL (calc) Final    Comment:    Reference range: <100 . Desirable range <100 mg/dL for primary prevention;   <70 mg/dL for patients with CHD or diabetic patients  with > or = 2 CHD risk factors. SABRA LDL-C is now calculated using the Martin-Hopkins  calculation, which is a validated novel method providing  better accuracy than the Friedewald equation in the  estimation of LDL-C.  Gladis APPLETHWAITE et al. SANDREA. 7986;689(80): 2061-2068  (http://education.[facility].com/faq/FAQ164)    HDL  Date Value Ref Range Status  05/09/2023 49 (L) > OR = 50 mg/dL Final   Triglycerides  Date Value Ref Range Status  05/09/2023 128 <150 mg/dL Final         Passed - Patient is not pregnant

## 2024-02-22 ENCOUNTER — Ambulatory Visit: Payer: Self-pay | Admitting: Internal Medicine

## 2024-02-22 LAB — COMPREHENSIVE METABOLIC PANEL WITH GFR
AG Ratio: 1.5 (calc) (ref 1.0–2.5)
ALT: 15 U/L (ref 6–29)
AST: 15 U/L (ref 10–35)
Albumin: 4.4 g/dL (ref 3.6–5.1)
Alkaline phosphatase (APISO): 46 U/L (ref 31–125)
BUN: 10 mg/dL (ref 7–25)
CO2: 28 mmol/L (ref 20–32)
Calcium: 9.3 mg/dL (ref 8.6–10.2)
Chloride: 102 mmol/L (ref 98–110)
Creat: 0.67 mg/dL (ref 0.50–0.99)
Globulin: 2.9 g/dL (ref 1.9–3.7)
Glucose, Bld: 106 mg/dL — ABNORMAL HIGH (ref 65–99)
Potassium: 4.4 mmol/L (ref 3.5–5.3)
Sodium: 138 mmol/L (ref 135–146)
Total Bilirubin: 0.3 mg/dL (ref 0.2–1.2)
Total Protein: 7.3 g/dL (ref 6.1–8.1)
eGFR: 108 mL/min/{1.73_m2} (ref >=60)

## 2024-02-22 LAB — LIPID PANEL
Cholesterol: 148 mg/dL (ref <200)
HDL: 50 mg/dL (ref >=50)
LDL Cholesterol (Calc): 78 mg/dL
Non-HDL Cholesterol (Calc): 98 mg/dL (ref <130)
Total CHOL/HDL Ratio: 3 (calc) (ref <5.0)
Triglycerides: 122 mg/dL (ref <150)

## 2024-02-22 LAB — CBC
HCT: 46.6 % — ABNORMAL HIGH (ref 35.0–45.0)
Hemoglobin: 15 g/dL (ref 11.7–15.5)
MCH: 30.9 pg (ref 27.0–33.0)
MCHC: 32.2 g/dL (ref 32.0–36.0)
MCV: 96.1 fL (ref 80.0–100.0)
MPV: 10.1 fL (ref 7.5–12.5)
Platelets: 277 10*3/uL (ref 140–400)
RBC: 4.85 10*6/uL (ref 3.80–5.10)
RDW: 12.6 % (ref 11.0–15.0)
WBC: 8.3 10*3/uL (ref 3.8–10.8)

## 2024-02-22 LAB — HEMOGLOBIN A1C
Hgb A1c MFr Bld: 6.2 % — ABNORMAL HIGH (ref <5.7)
Mean Plasma Glucose: 131 mg/dL
eAG (mmol/L): 7.3 mmol/L

## 2024-03-06 ENCOUNTER — Other Ambulatory Visit: Payer: Self-pay

## 2024-03-06 ENCOUNTER — Telehealth: Payer: Self-pay

## 2024-03-06 DIAGNOSIS — Z1211 Encounter for screening for malignant neoplasm of colon: Secondary | ICD-10-CM

## 2024-03-06 MED ORDER — NA SULFATE-K SULFATE-MG SULF 17.5-3.13-1.6 GM/177ML PO SOLN: 1.0000 | Freq: Once | ORAL | 0 refills | Status: AC

## 2024-03-06 NOTE — Telephone Encounter (Signed)
 [employer] [place] [id number]  Gastroenterology Pre-Procedure Review  Request Date: 05/29/24 Requesting Physician: Dr. Jinny  PATIENT REVIEW QUESTIONS: The patient responded to the following health history questions as indicated:    1. Are you having any GI issues? Burping after eating acetic foods 2. Do you have a personal history of Polyps? no 3. Do you have a family history of Colon Cancer or Polyps? no 4. Diabetes Mellitus? no 5. Joint replacements in the past 12 months?no 6. Major health problems in the past 3 months?no 7. Any artificial heart valves, MVP, or defibrillator?no    MEDICATIONS & ALLERGIES:    Patient reports the following regarding taking any anticoagulation/antiplatelet therapy:   Plavix, Coumadin, Eliquis, Xarelto, Lovenox, Pradaxa, Brilinta, or Effient? no Aspirin? no  Patient confirms/reports the following medications:  Current Outpatient Medications  Medication Sig Dispense Refill   atorvastatin  (LIPITOR) 10 MG tablet TAKE 1 TABLET(10 MG) BY MOUTH DAILY 90 tablet 0   diclofenac  (VOLTAREN ) 50 MG EC tablet Take 1 tablet (50 mg total) by mouth 2 (two) times daily as needed for moderate pain (knees and hands). 60 tablet 2   docusate sodium  (COLACE) 100 MG capsule Take 1 capsule (100 mg total) by mouth 2 (two) times daily. 10 capsule 0   ibuprofen  (ADVIL ) 600 MG tablet Take 1 tablet (600 mg total) by mouth every 8 (eight) hours as needed. 30 tablet 0   loratadine  (CLARITIN ) 10 MG tablet Take 1 tablet (10 mg total) by mouth daily as needed for allergies. 90 tablet 1   polyethylene glycol powder (GLYCOLAX /MIRALAX ) 17 GM/SCOOP powder Take 17 g by mouth 2 (two) times daily as needed. 3350 g 1   No current facility-administered medications for this visit.    Patient confirms/reports the following allergies:  No Known Allergies  No orders of the defined types were placed in this encounter.   AUTHORIZATION INFORMATION Primary Insurance: 1D#: Group  #:  Secondary Insurance: 1D#: Group #:  SCHEDULE INFORMATION: Date: 05/29/24 Time: Location: [facility]

## 2024-04-16 ENCOUNTER — Ambulatory Visit: Payer: Medicaid Other | Admitting: Plastic Surgery

## 2024-05-07 ENCOUNTER — Ambulatory Visit
Admission: RE | Admit: 2024-05-07 | Discharge: 2024-05-07 | Disposition: A | Discharge status: Home / Self Care | Source: Ambulatory Visit | Attending: Internal Medicine | Admitting: Internal Medicine

## 2024-05-07 ENCOUNTER — Encounter: Payer: Self-pay | Admitting: Radiology

## 2024-05-07 DIAGNOSIS — Z1231 Encounter for screening mammogram for malignant neoplasm of breast: Secondary | ICD-10-CM | POA: Diagnosis present

## 2024-05-29 ENCOUNTER — Ambulatory Visit

## 2024-05-29 ENCOUNTER — Other Ambulatory Visit: Payer: Self-pay

## 2024-05-29 ENCOUNTER — Encounter
Admission: RE | Disposition: A | Discharge status: Home / Self Care | Payer: Self-pay | Source: Home / Self Care | Attending: Gastroenterology

## 2024-05-29 ENCOUNTER — Ambulatory Visit
Admission: RE | Admit: 2024-05-29 | Discharge: 2024-05-29 | Disposition: A | Discharge status: Home / Self Care | Attending: Gastroenterology | Admitting: Gastroenterology

## 2024-05-29 ENCOUNTER — Encounter: Payer: Self-pay | Admitting: Gastroenterology

## 2024-05-29 DIAGNOSIS — D122 Benign neoplasm of ascending colon: Secondary | ICD-10-CM | POA: Diagnosis not present

## 2024-05-29 DIAGNOSIS — K5730 Diverticulosis of large intestine without perforation or abscess without bleeding: Secondary | ICD-10-CM | POA: Diagnosis not present

## 2024-05-29 DIAGNOSIS — K640 First degree hemorrhoids: Secondary | ICD-10-CM | POA: Insufficient documentation

## 2024-05-29 DIAGNOSIS — D123 Benign neoplasm of transverse colon: Secondary | ICD-10-CM

## 2024-05-29 DIAGNOSIS — K635 Polyp of colon: Secondary | ICD-10-CM | POA: Diagnosis not present

## 2024-05-29 DIAGNOSIS — Z1211 Encounter for screening for malignant neoplasm of colon: Secondary | ICD-10-CM | POA: Diagnosis present

## 2024-05-29 HISTORY — PX: COLONOSCOPY: SHX5424

## 2024-05-29 LAB — POCT PREGNANCY, URINE: Preg Test, Ur: NEGATIVE

## 2024-05-29 SURGERY — COLONOSCOPY
Anesthesia: General

## 2024-05-29 MED ORDER — PROPOFOL 500 MG/50ML IV EMUL
INTRAVENOUS | Status: DC | PRN
  Administered 2024-05-29: 150 ug/kg/min via INTRAVENOUS

## 2024-05-29 MED ORDER — LIDOCAINE HCL (CARDIAC) PF 100 MG/5ML IV SOSY
PREFILLED_SYRINGE | INTRAVENOUS | Status: DC | PRN
  Administered 2024-05-29: 100 mg via INTRAVENOUS

## 2024-05-29 MED ORDER — PROPOFOL 10 MG/ML IV BOLUS
INTRAVENOUS | Status: DC | PRN
  Administered 2024-05-29: 100 mg via INTRAVENOUS

## 2024-05-29 MED ORDER — SODIUM CHLORIDE 0.9 % IV SOLN: INTRAVENOUS | Status: DC

## 2024-05-29 MED ORDER — LIDOCAINE HCL (PF) 2 % IJ SOLN
INTRAMUSCULAR | Status: AC
Start: 2024-05-29 — End: 2024-05-29
  Filled 2024-05-29: qty 5

## 2024-05-29 NOTE — Anesthesia Postprocedure Evaluation (Signed)
 Anesthesia Post Note  Patient: Megan Knight  Procedure(s) Performed: COLONOSCOPY  Patient location during evaluation: PACU Anesthesia Type: General Level of consciousness: awake and alert Pain management: pain level controlled Vital Signs Assessment: post-procedure vital signs reviewed and stable Respiratory status: spontaneous breathing, nonlabored ventilation, respiratory function stable and patient connected to nasal cannula oxygen Cardiovascular status: blood pressure returned to baseline and stable Postop Assessment: no apparent nausea or vomiting Anesthetic complications: no   No notable events documented.   Last Vitals:  Vitals:   05/29/24 1204 05/29/24 1226  BP: 98/63 (!) 113/51  Pulse: 78 78  Resp: 15 (!) 9  Temp: (!) 36.2 C (!) 36.2 C  SpO2: 100%     Last Pain:  Vitals:   05/29/24 1226  TempSrc: Temporal  PainSc:                  [name]

## 2024-05-29 NOTE — H&P (Signed)
Rogelia Copping, MD [facility] [address], [address] [place], [place] [address] Phone: [phone] Fax : [phone]  Primary Care Physician:  Antonette Angeline ORN, NP Primary Gastroenterologist:  Dr. Copping  Pre-Procedure History & Physical: HPI:  Megan Knight is a 48 y.o. female is here for a screening colonoscopy.   Past Medical History:  Diagnosis Date   Medical history non-contributory     Past Surgical History:  Procedure Laterality Date   AUGMENTATION MAMMAPLASTY     BREAST SURGERY Bilateral    breast augmentation/New implants June 2024   CESAREAN SECTION     CHOLECYSTECTOMY N/A 11/30/2017   Procedure: LAPAROSCOPIC CHOLECYSTECTOMY;  Surgeon: Nicholaus Selinda Birmingham, MD;  Location: [facility] ORS;  Service: General;  Laterality: N/A;   COSMETIC SURGERY  2006    Prior to Admission medications   Medication Sig Start Date End Date Taking? Authorizing Provider  atorvastatin  (LIPITOR) 10 MG tablet TAKE 1 TABLET(10 MG) BY MOUTH DAILY 02/21/24  Yes Baity, Angeline ORN, NP  ibuprofen  (ADVIL ) 600 MG tablet Take 1 tablet (600 mg total) by mouth every 8 (eight) hours as needed. 02/28/23  Yes Waddell Leonce NOVAK, MD  loratadine  (CLARITIN ) 10 MG tablet Take 1 tablet (10 mg total) by mouth daily as needed for allergies. 01/14/20  Yes Malfi, Nat HERO, FNP  diclofenac  (VOLTAREN ) 50 MG EC tablet Take 1 tablet (50 mg total) by mouth 2 (two) times daily as needed for moderate pain (knees and hands). 01/14/20   Malfi, Nat HERO, FNP  docusate sodium  (COLACE) 100 MG capsule Take 1 capsule (100 mg total) by mouth 2 (two) times daily. 02/28/23   Waddell Leonce NOVAK, MD  polyethylene glycol powder (GLYCOLAX /MIRALAX ) 17 GM/SCOOP powder Take 17 g by mouth 2 (two) times daily as needed. 02/28/23   Waddell Leonce NOVAK, MD    Allergies as of 03/06/2024   (No Known Allergies)    Family History  Problem Relation Age of Onset   Cancer - Colon Neg Hx    Breast cancer Neg Hx     Social History   Socioeconomic History   Marital  status: Divorced    Spouse name: Not on file   Number of children: Not on file   Years of education: Not on file   Highest education level: Not on file  Occupational History   Not on file  Tobacco Use   Smoking status: Never   Smokeless tobacco: Never  Vaping Use   Vaping status: Never Used  Substance and Sexual Activity   Alcohol use: Yes    Comment: rarely < 2 per occasion, less than monthly consumption of alcohol   Drug use: No   Sexual activity: Yes    Birth control/protection: Implant    Comment: mutually monogamous relationship  Other Topics Concern   Not on file  Social History Narrative   Not on file   Social Drivers of Health   Financial Resource Strain: Not on file  Food Insecurity: Not on file  Transportation Needs: Not on file  Physical Activity: Not on file  Stress: Not on file  Social Connections: Not on file  Intimate Partner Violence: Not on file    Review of Systems: See HPI, otherwise negative ROS  Physical Exam: BP 117/82   Pulse 79   Temp (!) 96.9 F (36.1 C) (Temporal)   Resp 15   Ht 5' 2.99 (1.6 m)   Wt 69.8 kg   SpO2 100%   BMI 27.25 kg/m  General:  Alert,  pleasant and cooperative in NAD Head:  Normocephalic and atraumatic. Neck:  Supple; no masses or thyromegaly. Lungs:  Clear throughout to auscultation.    Heart:  Regular rate and rhythm. Abdomen:  Soft, nontender and nondistended. Normal bowel sounds, without guarding, and without rebound.   Neurologic:  Alert and  oriented x4;  grossly normal neurologically.  Impression/Plan: Megan Knight is now here to undergo a screening colonoscopy.  Risks, benefits, and alternatives regarding colonoscopy have been reviewed with the patient.  Questions have been answered.  All parties agreeable.

## 2024-05-29 NOTE — Op Note (Signed)
 [facility]astroenterology Patient Name: Megan Knight Procedure Date: 05/29/2024 11:38 AM MRN: [id number] Account #: [id number] Date of Birth: [birth year] Admit Type: Outpatient Age: 48 Room: [facility] ENDO ROOM 4 Gender: Female Note Status: Finalized Instrument Name: Colon Scope [id number] Procedure:             Colonoscopy Indications:           Screening for colorectal malignant neoplasm Providers:             Rogelia Copping MD, MD Medicines:             Propofol  per Anesthesia Complications:         No immediate complications. Procedure:             Pre-Anesthesia Assessment:                        - Prior to the procedure, a History and Physical was                         performed, and patient medications and allergies were                         reviewed. The patient's tolerance of previous                         anesthesia was also reviewed. The risks and benefits                         of the procedure and the sedation options and risks                         were discussed with the patient. All questions were                         answered, and informed consent was obtained. Prior                         Anticoagulants: The patient has taken no anticoagulant                         or antiplatelet agents. ASA Grade Assessment: II - A                         patient with mild systemic disease. After reviewing                         the risks and benefits, the patient was deemed in                         satisfactory condition to undergo the procedure.                        After obtaining informed consent, the colonoscope was                         passed under direct vision. Throughout the procedure,                         the  patient's blood pressure, pulse, and oxygen                         saturations were monitored continuously. The                         Colonoscope was introduced through the anus and                         advanced to the the  cecum, identified by appendiceal                         orifice and ileocecal valve. The colonoscopy was                         performed without difficulty. The patient tolerated                         the procedure well. The quality of the bowel                         preparation was excellent. Findings:      The perianal and digital rectal examinations were normal.      A 2 mm polyp was found in the ascending colon. The polyp was sessile.       The polyp was removed with a cold biopsy forceps. Resection and       retrieval were complete.      A 3 mm polyp was found in the transverse colon. The polyp was sessile.       The polyp was removed with a cold biopsy forceps. Resection and       retrieval were complete.      Non-bleeding internal hemorrhoids were found during retroflexion. The       hemorrhoids were Grade I (internal hemorrhoids that do not prolapse).      Multiple small-mouthed diverticula were found in the sigmoid colon. Impression:            - One 2 mm polyp in the ascending colon, removed with                         a cold biopsy forceps. Resected and retrieved.                        - One 3 mm polyp in the transverse colon, removed with                         a cold biopsy forceps. Resected and retrieved.                        - Non-bleeding internal hemorrhoids.                        - Diverticulosis in the sigmoid colon. Recommendation:        - Discharge patient to home.                        - Resume previous diet.                        -  Continue present medications.                        - Await pathology results.                        - If the pathology report reveals adenomatous tissue,                         then repeat the colonoscopy for surveillance in 7                         years. Procedure Code(s):     --- Professional ---                        [phone], Colonoscopy, flexible; with biopsy, single or                          multiple Diagnosis Code(s):     --- Professional ---                        Z12.11, Encounter for screening for malignant neoplasm                         of colon                        D12.3, Benign neoplasm of transverse colon (hepatic                         flexure or splenic flexure) CPT copyright 2022 American Medical Association. All rights reserved. The codes documented in this report are preliminary and upon coder review may  be revised to meet current compliance requirements. Rogelia Copping MD, MD 05/29/2024 12:09:10 PM This report has been signed electronically. Number of Addenda: 0 Note Initiated On: 05/29/2024 11:38 AM Scope Withdrawal Time: 0 hours 7 minutes 45 seconds  Total Procedure Duration: 0 hours 10 minutes 28 seconds  Estimated Blood Loss:  Estimated blood loss: none.      [facility]

## 2024-05-29 NOTE — Transfer of Care (Signed)
 Immediate Anesthesia Transfer of Care Note  Patient: Megan Knight  Procedure(s) Performed: COLONOSCOPY  Patient Location: PACU  Anesthesia Type:General  Level of Consciousness: drowsy  Airway & Oxygen Therapy: Patient Spontanous Breathing  Post-op Assessment: Report given to RN and Post -op Vital signs reviewed and stable  Post vital signs: Reviewed and stable  Last Vitals:  Vitals Value Taken Time  BP 98/63 05/29/24 12:04  Temp 36.2 C 05/29/24 12:04  Pulse 77 05/29/24 12:05  Resp 17 05/29/24 12:05  SpO2 100 % 05/29/24 12:05  Vitals shown include unfiled device data.  Last Pain:  Vitals:   05/29/24 1204  TempSrc: Temporal  PainSc: 0-No pain         Complications: No notable events documented.

## 2024-05-29 NOTE — Anesthesia Preprocedure Evaluation (Signed)
 Anesthesia Evaluation  Patient identified by MRN, date of birth, ID band Patient awake    Reviewed: Allergy & Precautions, H&P , NPO status , Patient's Chart, lab work & pertinent test results, reviewed documented beta blocker date and time   Airway Mallampati: II   Neck ROM: full    Dental  (+) Poor Dentition   Pulmonary neg pulmonary ROS   Pulmonary exam normal        Cardiovascular Exercise Tolerance: Good negative cardio ROS Normal cardiovascular exam Rhythm:regular Rate:Normal     Neuro/Psych negative neurological ROS  negative psych ROS   GI/Hepatic negative GI ROS, Neg liver ROS,,,  Endo/Other  negative endocrine ROS    Renal/GU negative Renal ROS  negative genitourinary   Musculoskeletal   Abdominal   Peds  Hematology negative hematology ROS (+)   Anesthesia Other Findings Past Medical History: No date: Medical history non-contributory Past Surgical History: No date: AUGMENTATION MAMMAPLASTY No date: BREAST SURGERY; Bilateral     Comment:  breast augmentation/New implants June 2024 No date: CESAREAN SECTION 11/30/2017: CHOLECYSTECTOMY; N/A     Comment:  Procedure: LAPAROSCOPIC CHOLECYSTECTOMY;  Surgeon:               Nicholaus Selinda Birmingham, MD;  Location: [facility] ORS;  Service:               General;  Laterality: N/A; 2006: COSMETIC SURGERY BMI    Body Mass Index: 27.25 kg/m     Reproductive/Obstetrics negative OB ROS                              Anesthesia Physical Anesthesia Plan  ASA: 1  Anesthesia Plan: General   Post-op Pain Management:    Induction:   PONV Risk Score and Plan:   Airway Management Planned:   Additional Equipment:   Intra-op Plan:   Post-operative Plan:   Informed Consent: I have reviewed the patients History and Physical, chart, labs and discussed the procedure including the risks, benefits and alternatives for the proposed anesthesia with  the patient or authorized representative who has indicated his/her understanding and acceptance.     Dental Advisory Given  Plan Discussed with: CRNA  Anesthesia Plan Comments:         Anesthesia Quick Evaluation

## 2024-05-31 LAB — SURGICAL PATHOLOGY

## 2024-06-02 ENCOUNTER — Ambulatory Visit: Payer: Self-pay | Admitting: Gastroenterology

## 2024-06-16 ENCOUNTER — Other Ambulatory Visit: Payer: Self-pay

## 2024-06-16 ENCOUNTER — Emergency Department
Admission: EM | Admit: 2024-06-16 | Discharge: 2024-06-16 | Disposition: A | Discharge status: Home / Self Care | Attending: Emergency Medicine | Admitting: Emergency Medicine

## 2024-06-16 ENCOUNTER — Emergency Department

## 2024-06-16 DIAGNOSIS — E876 Hypokalemia: Secondary | ICD-10-CM | POA: Diagnosis not present

## 2024-06-16 DIAGNOSIS — R1013 Epigastric pain: Secondary | ICD-10-CM | POA: Insufficient documentation

## 2024-06-16 DIAGNOSIS — R109 Unspecified abdominal pain: Secondary | ICD-10-CM

## 2024-06-16 LAB — HEPATIC FUNCTION PANEL
ALT: 16 U/L (ref 0–44)
AST: 21 U/L (ref 15–41)
Albumin: 3.9 g/dL (ref 3.5–5.0)
Alkaline Phosphatase: 42 U/L (ref 38–126)
Bilirubin, Direct: <0.1 mg/dL (ref 0.0–0.2)
Total Bilirubin: 0.2 mg/dL (ref 0.0–1.2)
Total Protein: 7.7 g/dL (ref 6.5–8.1)

## 2024-06-16 LAB — CBC
HCT: 42.6 % (ref 36.0–46.0)
Hemoglobin: 14.6 g/dL (ref 12.0–15.0)
MCH: 31.3 pg (ref 26.0–34.0)
MCHC: 34.3 g/dL (ref 30.0–36.0)
MCV: 91.2 fL (ref 80.0–100.0)
Platelets: 262 K/uL (ref 150–400)
RBC: 4.67 MIL/uL (ref 3.87–5.11)
RDW: 12.5 % (ref 11.5–15.5)
WBC: 10 K/uL (ref 4.0–10.5)
nRBC: 0 % (ref 0.0–0.2)

## 2024-06-16 LAB — BASIC METABOLIC PANEL WITH GFR
Anion gap: 10 (ref 5–15)
BUN: 12 mg/dL (ref 6–20)
CO2: 26 mmol/L (ref 22–32)
Calcium: 9.6 mg/dL (ref 8.9–10.3)
Chloride: 103 mmol/L (ref 98–111)
Creatinine, Ser: 0.68 mg/dL (ref 0.44–1.00)
GFR, Estimated: >60 mL/min (ref >60)
Glucose, Bld: 117 mg/dL — ABNORMAL HIGH (ref 70–99)
Potassium: 3.4 mmol/L — ABNORMAL LOW (ref 3.5–5.1)
Sodium: 139 mmol/L (ref 135–145)

## 2024-06-16 LAB — URINALYSIS, ROUTINE W REFLEX MICROSCOPIC
Bacteria, UA: NONE SEEN
Bilirubin Urine: NEGATIVE
Glucose, UA: NEGATIVE mg/dL
Hgb urine dipstick: NEGATIVE
Ketones, ur: NEGATIVE mg/dL
Nitrite: NEGATIVE
Protein, ur: NEGATIVE mg/dL
Specific Gravity, Urine: 1.02 (ref 1.005–1.030)
pH: 6 (ref 5.0–8.0)

## 2024-06-16 LAB — LIPASE, BLOOD: Lipase: 41 U/L (ref 11–51)

## 2024-06-16 LAB — POC URINE PREG, ED: Preg Test, Ur: NEGATIVE

## 2024-06-16 LAB — TROPONIN I (HIGH SENSITIVITY): Troponin I (High Sensitivity): <2 ng/L (ref <18)

## 2024-06-16 MED ORDER — SODIUM CHLORIDE 0.9 % IV BOLUS
1000.0000 mL | Freq: Once | INTRAVENOUS | Status: AC
  Administered 2024-06-16: 1000 mL via INTRAVENOUS

## 2024-06-16 MED ORDER — LIDOCAINE VISCOUS HCL 2 % MT SOLN
15.0000 mL | Freq: Once | OROMUCOSAL | Status: AC
  Administered 2024-06-16: 15 mL via ORAL
  Filled 2024-06-16: qty 15

## 2024-06-16 MED ORDER — SUCRALFATE 1 G PO TABS: 1.0000 g | ORAL_TABLET | Freq: Four times a day (QID) | ORAL | 0 refills | Status: DC

## 2024-06-16 MED ORDER — PANTOPRAZOLE SODIUM 40 MG PO TBEC: 40.0000 mg | DELAYED_RELEASE_TABLET | Freq: Every day | ORAL | 1 refills | Status: DC

## 2024-06-16 MED ORDER — IOHEXOL 300 MG/ML  SOLN
100.0000 mL | Freq: Once | INTRAMUSCULAR | Status: AC | PRN
  Administered 2024-06-16: 100 mL via INTRAVENOUS

## 2024-06-16 MED ORDER — ALUM & MAG HYDROXIDE-SIMETH 200-200-20 MG/5ML PO SUSP
30.0000 mL | Freq: Once | ORAL | Status: AC
  Administered 2024-06-16: 30 mL via ORAL
  Filled 2024-06-16: qty 30

## 2024-06-16 NOTE — ED Triage Notes (Signed)
 Patient brought in by son for epigastric pain and shortness of breath worse with sitting since yesterday. Reports nausea without vomiting. AxOx4, ambulatory with steady gait.

## 2024-06-16 NOTE — ED Provider Notes (Signed)
[facility] Provider Note    Event Date/Time   First MD Initiated Contact with Patient 06/16/24 0340     (approximate)  History   Chief Complaint: Abdominal Pain and Shortness of Breath  HPI  Adwoa OLENA WILLY is a 48 y.o. female with no significant past medical history presents to the emergency department for abdominal discomfort and shortness of breath.  According to the patient for the last 2 to 3 days she has been experiencing epigastric discomfort radiating into the mid abdomen.  Patient states nausea at times but denies any vomiting or diarrhea.  No constipation.  Patient states the pain makes her feel short of breath at times although denies any pain in the chest.  No known fever, 99.2 in the emergency department.  No urinary symptoms.  Physical Exam   Triage Vital Signs: ED Triage Vitals  Encounter Vitals Group     BP 06/16/24 0315 (!) 138/93     Girls Systolic BP Percentile --      Girls Diastolic BP Percentile --      Boys Systolic BP Percentile --      Boys Diastolic BP Percentile --      Pulse Rate 06/16/24 0315 94     Resp 06/16/24 0315 18     Temp 06/16/24 0315 99.2 F (37.3 C)     Temp Source 06/16/24 0315 Oral     SpO2 06/16/24 0315 100 %     Weight 06/16/24 0321 153 lb (69.4 kg)     Height 06/16/24 0321 5' 3 (1.6 m)     Head Circumference --      Peak Flow --      Pain Score 06/16/24 0318 5     Pain Loc --      Pain Education --      Exclude from Growth Chart --     Most recent vital signs: Vitals:   06/16/24 0315  BP: (!) 138/93  Pulse: 94  Resp: 18  Temp: 99.2 F (37.3 C)  SpO2: 100%    General: Awake, no distress.  CV:  Good peripheral perfusion.  Regular rate and rhythm  Resp:  Normal effort.  Equal breath sounds bilaterally.  Abd:  No distention.  Soft, epigastric tenderness otherwise benign abdomen  ED Results / Procedures / Treatments   EKG  EKG viewed and interpreted by myself shows a normal sinus rhythm at  83 bpm with a narrow QRS, normal axis, normal intervals, no concerning ST changes.  RADIOLOGY  I have reviewed and interpreted the chest x-ray images.  No consolidation on my evaluation. Radiology has read the x-ray as vascular congestion without edema. CT has resulted concerning for a right renal cell carcinoma.  MEDICATIONS ORDERED IN ED: Medications  alum & mag hydroxide-simeth (MAALOX/MYLANTA) 200-200-20 MG/5ML suspension 30 mL (30 mLs Oral Given 06/16/24 0422)    And  lidocaine  (XYLOCAINE ) 2 % viscous mouth solution 15 mL (15 mLs Oral Given 06/16/24 0422)  sodium chloride 0.9 % bolus 1,000 mL (1,000 mLs Intravenous New Bag/Given 06/16/24 0425)  iohexol (OMNIPAQUE) 300 MG/ML solution 100 mL (100 mLs Intravenous Contrast Given 06/16/24 0408)     IMPRESSION / MDM / ASSESSMENT AND PLAN / ED COURSE  I reviewed the triage vital signs and the nursing notes.  Patient's presentation is most consistent with acute presentation with potential threat to life or bodily function.  Patient presents to the emergency department for complaints of abdominal discomfort mostly in the  epigastric region over the last 2 to 3 days some abdominal bloating.  Patient states she has been belching a lot as well.  Patient states at times when the pain occurs it makes her feel short of breath.  Denies any chest pain.  No pleuritic pain.  No fever no urinary symptoms.  States nausea at times but no vomiting.  No diarrhea or constipation.  Patient does have mild epigastric tenderness on exam.  Patient's workup is reassuring so far with a normal CBC with a normal white blood cell count, reassuring chemistry, normal lipase.  We will add on LFTs as a precaution although the patient states that is postcholecystectomy 2019.  We will proceed with a CT scan abdomen pelvis to further evaluate.  Patient agreeable to plan of care.  Patient's workup does not appear to show any obvious cause for her discomfort.  Reassuring CBC  reassuring chemistry pregnancy test is negative.  Troponin is normal.  Hepatic function panel is normal.  Lipase is normal.  Urinalysis is normal.  CT scan shows no obvious reason for pain but it does show a 14 x 17 mm right lower kidney lesion suspicious for renal cell carcinoma without metastatic evidence.  I had a long discussion with the patient regarding these findings.  Patient understands she needs to follow-up with oncology as well as urology.  She will call on Monday.  I also sent oncology a secure chat regarding the patient with her information.  Given the patient's description of the symptoms we will discharge on sucralfate and Protonix have the patient follow-up with her doctor regarding the abdominal bloating.  FINAL CLINICAL IMPRESSION(S) / ED DIAGNOSES   Abdominal pain Renal cell carcinoma   Note:  This document was prepared using Dragon voice recognition software and may include unintentional dictation errors.   Dorothyann Drivers, MD 06/16/24 [phone]

## 2024-06-16 NOTE — ED Notes (Signed)
 Patient transported to X-ray

## 2024-06-16 NOTE — Discharge Instructions (Signed)
 Please call the numbers provided for oncology as well as urology Monday morning to arrange follow-up appointments with both.  As we discussed your CT scan shows signs of a possible renal cell carcinoma (cancer) that needs to be addressed quickly.  Please take your medications as prescribed.  Return to the emergency department for any worsening pain or any other symptom concerning to yourself.  Otherwise please follow-up with your doctor for recheck/reevaluation.

## 2024-06-20 DIAGNOSIS — N2889 Other specified disorders of kidney and ureter: Secondary | ICD-10-CM | POA: Insufficient documentation

## 2024-06-20 NOTE — Assessment & Plan Note (Signed)
 1.7 cm enhancing RLP renal mass, exophytic   Reviewed her clinical history and recent CT imaging.  She was found to have a small right lower pole renal mass, with measurable enhancement and characteristics concerning for a small RCC.  All concerning for malignancy, masses of this size pose little immediate threat and very low metastatic potential.  Often, active surveillance is a very reasonable initial strategy, particularly at mass is less than 2 cm.  I think interval CT imaging and 4-6 months would be appropriate.  However at her age, as well as the favorable characteristics of the mass (exophytic, lower pole location)-would be reasonable to pursue upright partial nephrectomy.  This would offer definitive pathology, and if malignant, offer very high cure rate.   Ultimately she was more comfortable with surgical treatment, sooner rather than later.  She preferred December/January surgery date.  I explained a robotic right partial nephrectomy in detail with her today including expected perioperative pathway, outcomes, recovery and possible complications.  Would expect a 2-3-hour operation with overnight stay.   - Tentatively schedule right robotic partial nephrectomy for Dec/Jan timeframe - order RBUS prior to next pre-op visit- need to ensure adequate US  visibility for intra-op location/identifcation  - RTC within 30 days of surgery date   - will need: CBC, BMP, coag panel, type and screen, UA

## 2024-06-20 NOTE — H&P (View-Only) (Signed)
06/22/24 10:31 AM   Megan Knight [birth year] [id number]   HPI: 48 y.o. female here for initial evaluation of a Right renal mass ED visit (06/16/24) - abdominal pain, SOB    - CT A/P w/ con (06/16/24) - 1.7 cm enhancing RLP renal mass  Visit completed via video [place] interpreter today Patient was very pleasant No prior urologic history Denies family history of GU malignancies No personal history of cancer She has subacute on chronic right flank pain Non-smoker  BMI 27 Prior chole in 2019, C-section, abdominal liposuction     PMH: Past Medical History:  Diagnosis Date   Bladder stone    Medical history non-contributory     Surgical History: Past Surgical History:  Procedure Laterality Date   AUGMENTATION MAMMAPLASTY     BREAST SURGERY Bilateral    breast augmentation/New implants June 2024   CESAREAN SECTION     CHOLECYSTECTOMY N/A 11/30/2017   Procedure: LAPAROSCOPIC CHOLECYSTECTOMY;  Surgeon: Nicholaus Selinda Birmingham, MD;  Location: [facility] ORS;  Service: General;  Laterality: N/A;   COLONOSCOPY N/A 05/29/2024   Procedure: COLONOSCOPY;  Surgeon: Jinny Carmine, MD;  Location: [facility] ENDOSCOPY;  Service: Endoscopy;  Laterality: N/A;   COSMETIC SURGERY  2006    Family History: Family History  Problem Relation Age of Onset   Cancer - Colon Neg Hx    Breast cancer Neg Hx     Social History:  reports that she has never smoked. She has never used smokeless tobacco. She reports current alcohol use. She reports that she does not use drugs.      Physical Exam: BP 125/81 (BP Location: Left Arm, Patient Position: Sitting, Cuff Size: Normal)   Pulse 67   Ht 5' 3 (1.6 m)   Wt 159 lb (72.1 kg)   SpO2 95%   BMI 28.17 kg/m    Constitutional:  Alert and oriented, No acute distress. Cardiovascular: No clubbing, cyanosis, or edema. Respiratory: Normal respiratory effort, no increased work of breathing. GI: Nondistended Skin: No rashes, bruises or suspicious  lesions. Neurologic: Grossly intact, no focal deficits, moving all 4 extremities. Psychiatric: Normal mood and affect.  Laboratory Data:  Latest Reference Range & Units 06/16/24 03:27  Creatinine 0.44 - 1.00 mg/dL 9.31     Pertinent Imaging: I have personally viewed and interpreted the CT A/P w/ con (06/16/24) - 1.7 cm enhancing RLP renal mass, primarily exophytic, 1 renal artery, 1 renal vein.  Contralateral left kidney morphologically normal in appearance.  Normal ureters and bladder.  No regional lymphadenopathy.  IMPRESSION: 1. 14 x 13 x 17 mm heterogeneously enhancing exophytic lesion lower pole right kidney, highly suspicious for renal cell carcinoma. No evidence for lymphadenopathy/metastatic disease. Urology consultation recommended. 2. Otherwise, no acute findings in the abdomen or pelvis. Specifically, no findings to explain the patient's history of epigastric pain.    Assessment & Plan:    Renal mass, right Assessment & Plan: 1.7 cm enhancing RLP renal mass, exophytic   Reviewed her clinical history and recent CT imaging.  She was found to have a small right lower pole renal mass, with measurable enhancement and characteristics concerning for a small RCC.  All concerning for malignancy, masses of this size pose little immediate threat and very low metastatic potential.  Often, active surveillance is a very reasonable initial strategy, particularly at mass is less than 2 cm.  I think interval CT imaging and 4-6 months would be appropriate.  However at her age, as well as the favorable  characteristics of the mass (exophytic, lower pole location)-would be reasonable to pursue upright partial nephrectomy.  This would offer definitive pathology, and if malignant, offer very high cure rate.   Ultimately she was more comfortable with surgical treatment, sooner rather than later.  She preferred December/January surgery date.  I explained a robotic right partial nephrectomy in detail  with her today including expected perioperative pathway, outcomes, recovery and possible complications.  Would expect a 2-3-hour operation with overnight stay.   - Tentatively schedule right robotic partial nephrectomy for Dec/Jan timeframe - order RBUS prior to next pre-op visit- need to ensure adequate US  visibility for intra-op location/identifcation  - RTC within 30 days of surgery date   - will need: CBC, BMP, coag panel, type and screen, UA  Orders: -     Ambulatory Referral For Surgery Scheduling -     US  RENAL; Future      Penne Skye, MD 06/22/2024  [facility] [address] [place], [place] [address] [phone]

## 2024-06-20 NOTE — Progress Notes (Unsigned)
06/22/24 10:31 AM   Megan Knight [birth year] [id number]   HPI: 48 y.o. female here for initial evaluation of a Right renal mass ED visit (06/16/24) - abdominal pain, SOB    - CT A/P w/ con (06/16/24) - 1.7 cm enhancing RLP renal mass  Visit completed via video [place] interpreter today Patient was very pleasant No prior urologic history Denies family history of GU malignancies No personal history of cancer She has subacute on chronic right flank pain Non-smoker  BMI 27 Prior chole in 2019, C-section, abdominal liposuction     PMH: Past Medical History:  Diagnosis Date   Bladder stone    Medical history non-contributory     Surgical History: Past Surgical History:  Procedure Laterality Date   AUGMENTATION MAMMAPLASTY     BREAST SURGERY Bilateral    breast augmentation/New implants June 2024   CESAREAN SECTION     CHOLECYSTECTOMY N/A 11/30/2017   Procedure: LAPAROSCOPIC CHOLECYSTECTOMY;  Surgeon: Nicholaus Selinda Birmingham, MD;  Location: [facility] ORS;  Service: General;  Laterality: N/A;   COLONOSCOPY N/A 05/29/2024   Procedure: COLONOSCOPY;  Surgeon: Jinny Carmine, MD;  Location: [facility] END[facility]COPY;  Service: Endoscopy;  Laterality: N/A;   COSMETIC SURGERY  2006    Family History: Family History  Problem Relation Age of Onset   Cancer - Colon Neg Hx    Breast cancer Neg Hx     Social History:  reports that she has never smoked. She has never used smokeless tobacco. She reports current alcohol use. She reports that she does not use drugs.      Physical Exam: BP 125/81 (BP Location: Left Arm, Patient Position: Sitting, Cuff Size: Normal)   Pulse 67   Ht 5' 3 (1.6 m)   Wt 159 lb (72.1 kg)   SpO2 95%   BMI 28.17 kg/m    Constitutional:  Alert and oriented, No acute distress. Cardiovascular: No clubbing, cyanosis, or edema. Respiratory: Normal respiratory effort, no increased work of breathing. GI: Nondistended Skin: No rashes, bruises or suspicious  lesions. Neurologic: Grossly intact, no focal deficits, moving all 4 extremities. Psychiatric: Normal mood and affect.  Laboratory Data:  Latest Reference Range & Units 06/16/24 03:27  Creatinine 0.44 - 1.00 mg/dL 9.31     Pertinent Imaging: I have personally viewed and interpreted the CT A/P w/ con (06/16/24) - 1.7 cm enhancing RLP renal mass, primarily exophytic, 1 renal artery, 1 renal vein.  Contralateral left kidney morphologically normal in appearance.  Normal ureters and bladder.  No regional lymphadenopathy.  IMPRESSION: 1. 14 x 13 x 17 mm heterogeneously enhancing exophytic lesion lower pole right kidney, highly suspicious for renal cell carcinoma. No evidence for lymphadenopathy/metastatic disease. Urology consultation recommended. 2. Otherwise, no acute findings in the abdomen or pelvis. Specifically, no findings to explain the patient's history of epigastric pain.    Assessment & Plan:    Renal mass, right Assessment & Plan: 1.7 cm enhancing RLP renal mass, exophytic   Reviewed her clinical history and recent CT imaging.  She was found to have a small right lower pole renal mass, with measurable enhancement and characteristics concerning for a small RCC.  All concerning for malignancy, masses of this size pose little immediate threat and very low metastatic potential.  Often, active surveillance is a very reasonable initial strategy, particularly at mass is less than 2 cm.  I think interval CT imaging and 4-6 months would be appropriate.  However at her age, as well as the favorable  characteristics of the mass (exophytic, lower pole location)-would be reasonable to pursue upright partial nephrectomy.  This would offer definitive pathology, and if malignant, offer very high cure rate.   Ultimately she was more comfortable with surgical treatment, sooner rather than later.  She preferred December/January surgery date.  I explained a robotic right partial nephrectomy in detail  with her today including expected perioperative pathway, outcomes, recovery and possible complications.  Would expect a 2-3-hour operation with overnight stay.   - Tentatively schedule right robotic partial nephrectomy for Dec/Jan timeframe - order RBUS prior to next pre-op visit- need to ensure adequate US  visibility for intra-op location/identifcation  - RTC within 30 days of surgery date   - will need: CBC, BMP, coag panel, type and screen, UA  Orders: -     Ambulatory Referral For Surgery Scheduling -     US  RENAL; Future      Penne Skye, MD 06/22/2024  [facility] [address] [place], [place] [address] [phone]

## 2024-06-22 ENCOUNTER — Encounter: Payer: Self-pay | Admitting: Urology

## 2024-06-22 ENCOUNTER — Ambulatory Visit (INDEPENDENT_AMBULATORY_CARE_PROVIDER_SITE_OTHER): Admitting: Urology

## 2024-06-22 VITALS — BP 125/81 | HR 67 | Ht 63.0 in | Wt 159.0 lb

## 2024-06-22 DIAGNOSIS — N2889 Other specified disorders of kidney and ureter: Secondary | ICD-10-CM

## 2024-06-22 NOTE — Patient Instructions (Addendum)
 Please call [phone] or [phone] to schedule your imaging prior to your appointment.   Minimally Invasive Nephrectomy Minimally invasive nephrectomy is a surgery to remove a kidney. The body has two kidneys. They work as a filter to clean the blood and remove waste. The kidneys also remove extra fluid from the body by making pee (urine). This surgery may be done to: Remove the entire kidney, adrenal gland, and some structures around them. This is called a radical nephrectomy. Remove only the damaged or diseased part of the kidney. This is called a partial nephrectomy. You may need this surgery if: Your kidney is badly damaged from disease, infection, injury, or cancer. You were born with an abnormal kidney. You are donating a healthy kidney. Tell a health care provider about: Any allergies you have. All medicines you take. These include vitamins, herbs, eye drops, and creams. Any problems you or family members have had with anesthesia. Any bleeding problems you have. Any surgeries you've had. Any medical conditions you have. Whether you're pregnant or may be pregnant. What are the risks? Your health care provider will talk with you about risks. These may include: Bleeding. Infection. Damage to other body structures near the kidney. Pulmonary embolism. This is when a blood clot forms in the leg and moves to the lung. Allergic reactions to medicines. Kidney failure. Changing to an open procedure. This may happen if a bone, usually part of a rib, needs to be removed so the surgeon can get to the kidney. What happens before the surgery? When to stop eating and drinking Eat and drink only as you've been told. You may be told this: 8 hours before your surgery Stop eating most foods. Do not eat meat, fried foods, or fatty foods. Eat only light foods, such as toast or crackers. All liquids are okay except energy drinks and alcohol. 6 hours before your surgery Stop eating. Drink  only clear liquids, such as water, clear fruit juice, black coffee, plain tea, and sports drinks. Do not drink energy drinks or alcohol. 2 hours before your surgery Stop drinking all liquids. You may be allowed to take medicines with small sips of water. If you do not eat and drink as told, your surgery may be delayed or canceled. Medicines Ask about changing or stopping: Any medicines you take. Any vitamins, herbs, or supplements you take. Do not take aspirin or ibuprofen  unless you're told to. Surgery safety For your safety, you may: Need to wash your skin with a soap that kills germs. Get antibiotics. Have your surgery site marked. Have hair removed at the surgery site. General instructions Do not smoke, vape, or use nicotine or tobacco for at least 4 weeks before the surgery. You might have your blood drawn and tested in case you need to receive blood, or get a transfusion, during surgery. What happens during the surgery?     An IV will be put into a vein in your hand or arm. You may be given: A sedative to help you relax. Anesthesia to keep you from feeling pain. A soft tube called a catheter will be placed in your bladder to drain pee. Several small cuts will be made in your belly. Special tools called trocars will be inserted through these cuts. Your belly will be filled with a gas. This gives the surgeon more room to work. A laparoscope will be put through one of the trocars. This is a thin, lighted tube with a camera on the end. The camera will  send images to a screen in the operating room. Surgical tools will be put through the other cuts. Sometimes, the surgeon may control these tools using a robot. This is called a robot-assisted nephrectomy. If you're having a partial nephrectomy: The blood vessels attached to your kidney will be clamped. Part of your kidney will be removed. The kidney will be closed with stitches or staples, and the clamp on the blood vessels will  be removed. If you're having a radical nephrectomy: All of the blood vessels that attach to your kidney will be tied off and separated from the kidney. Part of your ureter will be removed. The ureter is the tube that carries pee from your kidney to your bladder. A larger cut may be made in your lower belly to take out the kidney. A small tube (drain) may be placed near one of the cuts to drain extra fluid. The cuts will be closed with stitches, staples, skin glue, or tape strips. A bandage will be placed over the area. These steps may vary. Ask what you can expect. What happens after the surgery? You'll be watched closely until you leave. This includes checking your pain level, blood pressure, heart rate, and breathing rate. You may have: A urinary catheter. How much you pee will be checked. A drain. Your surgical drain will be removed after a few days. Pain medicine given through your IV. Compression stockings to reduce swelling and help prevent blood clots in your legs. You'll be told to: Get out of bed and walk as soon as you can. Do breathing exercises, like coughing and breathing deeply. This helps prevent pneumonia. Where to find more information American Cancer Society: cancer.org This information is not intended to replace advice given to you by your health care provider. Make sure you discuss any questions you have with your health care provider. Document Revised: 02/02/2023 Document Reviewed: 02/02/2023 Elsevier Patient Education  2024 [employer].

## 2024-06-25 ENCOUNTER — Encounter: Payer: Self-pay | Admitting: Oncology

## 2024-06-25 ENCOUNTER — Inpatient Hospital Stay

## 2024-06-25 ENCOUNTER — Inpatient Hospital Stay: Attending: Oncology | Admitting: Oncology

## 2024-06-25 ENCOUNTER — Other Ambulatory Visit: Payer: Self-pay

## 2024-06-25 VITALS — BP 112/74 | HR 73 | Temp 97.5°F | Resp 18 | Ht 63.0 in | Wt 159.0 lb

## 2024-06-25 DIAGNOSIS — Z79899 Other long term (current) drug therapy: Secondary | ICD-10-CM | POA: Diagnosis not present

## 2024-06-25 DIAGNOSIS — N2889 Other specified disorders of kidney and ureter: Secondary | ICD-10-CM

## 2024-06-25 DIAGNOSIS — E7800 Pure hypercholesterolemia, unspecified: Secondary | ICD-10-CM | POA: Insufficient documentation

## 2024-06-25 DIAGNOSIS — Z9049 Acquired absence of other specified parts of digestive tract: Secondary | ICD-10-CM | POA: Diagnosis not present

## 2024-06-25 DIAGNOSIS — Z860100 Personal history of colon polyps, unspecified: Secondary | ICD-10-CM | POA: Insufficient documentation

## 2024-06-25 NOTE — Progress Notes (Signed)
Hematology/Oncology Consult note [facility] Telephone:([phone] [phone]  Patient Care Team: Antonette Angeline ORN, NP as PCP - General (Internal Medicine)   Name of the patient: Megan Knight  [id number]  [birth year]    Reason for referral-right renal mass   Referring physician-Dr. Dorothyann  Date of visit: 06/25/24   History of presenting illness- Discussed the use of AI scribe software for clinical note transcription with the patient, who gave verbal consent to proceed.  History of Present Illness   Megan Knight is a 48 year old female who presents with a small mass in her right kidney discovered incidentally during a CT scan for abdominal pain. She was referred by the ER for evaluation of a small mass in her right kidney.  She initially presented to the emergency room with abdominal pain, which led to a CT scan of the abdomen. The CT scan did not reveal the cause of the abdominal pain but incidentally found a small mass in her right kidney in the lower pole measuring approximately 1.7 centimeters.  Following the discovery of the kidney mass, an urgent referral was made for further evaluation.   ECOG PS- 0  Pain scale- 0   Review of systems- ROS  Allergies  Allergen Reactions   Shellfish Allergy Hives    Patient Active Problem List   Diagnosis Date Noted   Renal mass, right 06/20/2024   Polyp of ascending colon 05/29/2024   Prediabetes 02/01/2023   Pure hypercholesterolemia 02/01/2023   Overweight with body mass index (BMI) of 28 to 28.9 in adult 01/21/2022   Encounter for screening colonoscopy 01/14/2020     Past Medical History:  Diagnosis Date   Bladder stone    Kidney lesion    Medical history non-contributory      Past Surgical History:  Procedure Laterality Date   AUGMENTATION MAMMAPLASTY     BREAST SURGERY Bilateral    breast augmentation/New implants June 2024   CESAREAN SECTION     CHOLECYSTECTOMY N/A  11/30/2017   Procedure: LAPAROSCOPIC CHOLECYSTECTOMY;  Surgeon: Nicholaus Selinda Birmingham, MD;  Location: [facility] ORS;  Service: General;  Laterality: N/A;   COLONOSCOPY N/A 05/29/2024   Procedure: COLONOSCOPY;  Surgeon: Jinny Carmine, MD;  Location: [facility] ENDOSCOPY;  Service: Endoscopy;  Laterality: N/A;   COSMETIC SURGERY  2006    Social History   Socioeconomic History   Marital status: Divorced    Spouse name: Not on file   Number of children: 3   Years of education: Not on file   Highest education level: Not on file  Occupational History   Not on file  Tobacco Use   Smoking status: Never   Smokeless tobacco: Never  Vaping Use   Vaping status: Never Used  Substance and Sexual Activity   Alcohol use: Not Currently    Comment: rarely < 2 per occasion, less than monthly consumption of alcohol   Drug use: Never   Sexual activity: Not Currently    Birth control/protection: Implant    Comment: mutually monogamous relationship  Other Topics Concern   Not on file  Social History Narrative   Not on file   Social Drivers of Health   Financial Resource Strain: Not on file  Food Insecurity: No Food Insecurity (06/25/2024)   Hunger Vital Sign    Worried About Running Out of Food in the Last Year: Never true    Ran Out of Food in the Last Year: Never true  Transportation Needs:  No Transportation Needs (06/25/2024)   PRAPARE - Administrator, Civil Service (Medical): No    Lack of Transportation (Non-Medical): No  Physical Activity: Not on file  Stress: Not on file  Social Connections: Not on file  Intimate Partner Violence: Not At Risk (06/25/2024)   Humiliation, Afraid, Rape, and Kick questionnaire    Fear of Current or Ex-Partner: No    Emotionally Abused: No    Physically Abused: No    Sexually Abused: No     Family History  Problem Relation Age of Onset   Healthy Mother    Healthy Brother    Cancer - Colon Neg Hx    Breast cancer Neg Hx      Current Outpatient  Medications:    atorvastatin  (LIPITOR) 10 MG tablet, TAKE 1 TABLET(10 MG) BY MOUTH DAILY, Disp: 90 tablet, Rfl: 0   pantoprazole (PROTONIX) 40 MG tablet, Take 1 tablet (40 mg total) by mouth daily., Disp: 30 tablet, Rfl: 1   sucralfate (CARAFATE) 1 g tablet, Take 1 tablet (1 g total) by mouth 4 (four) times daily for 15 days., Disp: 60 tablet, Rfl: 0   Physical exam:  Vitals:   06/25/24 1445  BP: 112/74  Pulse: 73  Resp: 18  Temp: (!) 97.5 F (36.4 C)  TempSrc: Tympanic  SpO2: 100%  Weight: 159 lb (72.1 kg)  Height: 5' 3 (1.6 m)   Physical Exam        Latest Ref Rng & Units 06/16/2024    3:27 AM  CMP  Glucose 70 - 99 mg/dL 882   BUN 6 - 20 mg/dL 12   Creatinine 9.55 - 1.00 mg/dL 9.31   Sodium 864 - 854 mmol/L 139   Potassium 3.5 - 5.1 mmol/L 3.4   Chloride 98 - 111 mmol/L 103   CO2 22 - 32 mmol/L 26   Calcium  8.9 - 10.3 mg/dL 9.6   Total Protein 6.5 - 8.1 g/dL 7.7   Total Bilirubin 0.0 - 1.2 mg/dL 0.2   Alkaline Phos 38 - 126 U/L 42   AST 15 - 41 U/L 21   ALT 0 - 44 U/L 16       Latest Ref Rng & Units 06/16/2024    3:27 AM  CBC  WBC 4.0 - 10.5 K/uL 10.0   Hemoglobin 12.0 - 15.0 g/dL 85.3   Hematocrit 63.9 - 46.0 % 42.6   Platelets 150 - 400 K/uL 262     No images are attached to the encounter.  CT ABDOMEN PELVIS W CONTRAST Result Date: 06/16/2024 CLINICAL DATA:  Epigastric pain and shortness of breath. Nausea and vomiting. EXAM: CT ABDOMEN AND PELVIS WITH CONTRAST TECHNIQUE: Multidetector CT imaging of the abdomen and pelvis was performed using the standard protocol following bolus administration of intravenous contrast. RADIATION DOSE REDUCTION: This exam was performed according to the departmental dose-optimization program which includes automated exposure control, adjustment of the mA and/or kV according to patient size and/or use of iterative reconstruction technique. CONTRAST:  100mL OMNIPAQUE IOHEXOL 300 MG/ML  SOLN COMPARISON:  None Available. FINDINGS:  Lower chest: No acute findings Hepatobiliary: No suspicious focal abnormality within the liver parenchyma. Gallbladder is surgically absent. No intrahepatic or extrahepatic biliary dilation. Pancreas: No focal mass lesion. No dilatation of the main duct. No intraparenchymal cyst. No peripancreatic edema. Spleen: No splenomegaly. No suspicious focal mass lesion. Adrenals/Urinary Tract: No adrenal nodule or mass. 14 x 13 x 17 mm heterogeneously enhancing exophytic lesion identified lower pole  right kidney, highly suspicious for renal cell carcinoma. Tiny well-defined homogeneous low-density lesion in the left kidney is too small to characterize but is statistically most likely benign and probably a cyst. No followup imaging is recommended. No evidence for hydroureter. The urinary bladder appears normal for the degree of distention. Stomach/Bowel: Stomach is unremarkable. No gastric wall thickening. No evidence of outlet obstruction. Duodenum is normally positioned as is the ligament of Treitz. No small bowel wall thickening. No small bowel dilatation. The terminal ileum is normal. The appendix is normal. No gross colonic mass. No colonic wall thickening. Vascular/Lymphatic: No abdominal aortic aneurysm. No abdominal aortic atherosclerotic calcification. There is no gastrohepatic or hepatoduodenal ligament lymphadenopathy. No retroperitoneal or mesenteric lymphadenopathy. No pelvic sidewall lymphadenopathy. Reproductive: The uterus is unremarkable. There is no adnexal mass. Cystic foci in the region of the vaginal introitus suggest Bartholin's gland cyst. Other: No intraperitoneal free fluid. Musculoskeletal: No worrisome lytic or sclerotic osseous abnormality. IMPRESSION: 1. 14 x 13 x 17 mm heterogeneously enhancing exophytic lesion lower pole right kidney, highly suspicious for renal cell carcinoma. No evidence for lymphadenopathy/metastatic disease. Urology consultation recommended. 2. Otherwise, no acute findings  in the abdomen or pelvis. Specifically, no findings to explain the patient's history of epigastric pain. I discussed these results by telephone with Dr. Dorothyann at proximally 0523 hours on 06/16/2024. Electronically Signed   By: Camellia Candle M.D.   On: 06/16/2024 05:25   DG Chest 2 View Result Date: 06/16/2024 EXAM: 2 VIEW(S) XRAY OF THE CHEST 06/16/2024 04:06:00 AM COMPARISON: Comparison made with prior radiograph from 01/29/2021. CLINICAL HISTORY: SOB. Patient brought in by son for epigastric pain and shortness of breath worse with sitting since yesterday. Reports nausea without vomiting. AxOx4, ambulatory with steady gait. FINDINGS: LUNGS AND PLEURA: Lungs are normally inflated. Mild perihilar vascular congestion without overt pulmonary edema. No focal pulmonary opacity. No focal infiltrates. No visible pleural effusion. No pneumothorax. HEART AND MEDIASTINUM: Cardiac and mediastinal silhouettes are stable in size and contour, and remain within normal limits. BONES AND SOFT TISSUES: No acute osseous abnormality. No acute finding of the visualized soft tissues. Cholecystectomy clips noted. IMPRESSION: 1. Mild perihilar vascular congestion without overt pulmonary edema. Electronically signed by: Morene Hoard MD 06/16/2024 04:35 AM EDT RP Workstation: [id number]    Assessment and plan- Patient is a 48 y.o. female referred for right renal mass  Assessment and Plan    Right renal mass 1.7 cm mass in right kidney, likely slow-growing. Surgery chosen due to young age and potential benefits. Chemotherapy not indicated. Immunotherapy not warranted. - Proceed with surgical excision in December or January.  It is unlikely that patient will need any adjuvant treatment following resection and at that discussion needs to be had Dr. Perla can refer the patient back to me - Coordinate with Dr. Georganne for postoperative follow-up and recurrence monitoring.   Thank you for this kind referral and the  opportunity to participate in the care of this  Patient   Visit Diagnosis 1. Renal mass, right     Dr. Annah Skene, MD, MPH [facility] at [facility] [phone] 06/25/2024

## 2024-06-25 NOTE — Progress Notes (Signed)
 Surgical Physician Order Form [facility]  Dr. Penne Skye, MD  * Scheduling expectation : ~Dec/Jan timeframe (w/ Dr. Francisca)  *Length of Case: 3 hours  *Clearance needed: no  *Anticoagulation Instructions: N/A  *Aspirin Instructions: N/A  *Post-op visit Date/Instructions:  4 week follow up  *Diagnosis: Right Kidney Mass  *Procedure: right Laparoscopic partial nephrectomy (robot or hand assist)([id number])   Additional orders: BK-drop in ultrasound  -Admit type: Observation  -Anesthesia: General  -VTE Prophylaxis Standing Order SCD's       Other:   -Standing Lab Orders Per Anesthesia    Lab other: CBC, BMP, coag panel, type and screen, UA  -Standing Test orders EKG/Chest x-ray per Anesthesia       Test other:   - Medications:  Ancef  2gm IV  -Other orders:  N/A

## 2024-07-02 NOTE — Progress Notes (Signed)
   [facility] Surgical Posting Form  Surgery Date: Date: 07/10/2024  Surgeon: Dr. Penne Skye, MD  Inpt ( Yes  )   Outpt (No)   Obs ( No  )   Diagnosis: N28.89 Right Kidney Mass  -CPT: [phone]  Surgery: Right Laparoscopic Robotic Partial Nephrectomy  Stop Anticoagulations: Yes and also hold ASA  Cardiac/Medical/Pulmonary Clearance needed: No  *Orders entered into EPIC  Date: 07/02/24   *Case booked in EPIC  Date: 07/02/24  *Notified pt of Surgery: Date: 07/02/24  PRE-OP UA & CX: yes, will also obtain CBC, BMP, PT, PTT, INR, UA and CX, Type and Screen   *Placed into Prior Authorization Work [place] Date: 07/02/24  Assistant/laser/rep: BK Laser Requested though [employer] Confirmation# [id number]

## 2024-07-04 ENCOUNTER — Other Ambulatory Visit: Payer: Self-pay

## 2024-07-04 ENCOUNTER — Encounter
Admission: RE | Admit: 2024-07-04 | Discharge: 2024-07-04 | Disposition: A | Discharge status: Home / Self Care | Source: Ambulatory Visit | Attending: Urology | Admitting: Urology

## 2024-07-04 DIAGNOSIS — Z01818 Encounter for other preprocedural examination: Secondary | ICD-10-CM

## 2024-07-04 HISTORY — DX: Prediabetes: R73.03

## 2024-07-04 HISTORY — DX: Gastro-esophageal reflux disease without esophagitis: K21.9

## 2024-07-04 HISTORY — DX: Hyperlipidemia, unspecified: E78.5

## 2024-07-04 NOTE — Patient Instructions (Addendum)
 Your procedure is scheduled on:07-10-24 Tuesday Report to the Registration Desk on the 1st floor of the [facility].Then proceed to the 2nd floor surgery desk To find out your arrival time, please call [phone] between 1PM - 3PM on:07-09-24 Monday If your arrival time is 6:00 am, do not arrive before that time as the [facility] entrance doors do not open until 6:00 am.  REMEMBER: Instructions that are not followed completely may result in serious medical risk, up to and including death; or upon the discretion of your surgeon and anesthesiologist your surgery may need to be rescheduled.  Do not eat food OR drink liquids after midnight the night before surgery.  No gum chewing or hard candies.  One week prior to surgery:Stop NOW (07-04-24) Stop Anti-inflammatories (NSAIDS) such as Advil , Aleve, Ibuprofen , Motrin , Naproxen, Naprosyn and Aspirin based products such as Excedrin, Goody's Powder, BC Powder. Stop ANY OVER THE COUNTER supplements until after surgery.  You may however, continue to take Tylenol  if needed for pain up until the day of surgery.  Continue taking all of your other prescription medications up until the day of surgery.  ON THE DAY OF SURGERY ONLY TAKE THESE MEDICATIONS WITH SIPS OF WATER: -pantoprazole (PROTONIX)   No Alcohol for 24 hours before or after surgery.  No Smoking including e-cigarettes for 24 hours before surgery.  No chewable tobacco products for at least 6 hours before surgery.  No nicotine patches on the day of surgery.  Do not use any recreational drugs for at least a week (preferably 2 weeks) before your surgery.  Please be advised that the combination of cocaine and anesthesia may have negative outcomes, up to and including death. If you test positive for cocaine, your surgery will be cancelled.  On the morning of surgery brush your teeth with toothpaste and water, you may rinse your mouth with mouthwash if you wish. Do not swallow any  toothpaste or mouthwash.  Use CHG Soap as directed on instruction sheet.  Do not wear jewelry, make-up, hairpins, clips or nail polish.  For welded (permanent) jewelry: bracelets, anklets, waist bands, etc.  Please have this removed prior to surgery.  If it is not removed, there is a chance that hospital personnel will need to cut it off on the day of surgery.  Do not wear lotions, powders, or perfumes.   Do not shave body hair from the neck down 48 hours before surgery.  Contact lenses, hearing aids and dentures may not be worn into surgery.  Do not bring valuables to the hospital. [facility] is not responsible for any missing/lost belongings or valuables.   Notify your doctor if there is any change in your medical condition (cold, fever, infection).  Wear comfortable clothing (specific to your surgery type) to the hospital.  After surgery, you can help prevent lung complications by doing breathing exercises.  Take deep breaths and cough every 1-2 hours. Your doctor may order a device called an Incentive Spirometer to help you take deep breaths. When coughing or sneezing, hold a pillow firmly against your incision with both hands. This is called "splinting." Doing this helps protect your incision. It also decreases belly discomfort.  If you are being admitted to the hospital overnight, leave your suitcase in the car. After surgery it may be brought to your room.  In case of increased patient census, it may be necessary for you, the patient, to continue your postoperative care in the Same Day Surgery department.  If you  are being discharged the day of surgery, you will not be allowed to drive home. You will need a responsible individual to drive you home and stay with you for 24 hours after surgery.   If you are taking public transportation, you will need to have a responsible individual with you.  Please call the Pre-admissions Testing Dept. at [phone] if you have any  questions about these instructions.  Surgery Visitation Policy:  Patients having surgery or a procedure may have two visitors.  Children under the age of 65 must have an adult with them who is not the patient.  Inpatient Visitation:    Visiting hours are 7 a.m. to 8 p.m. Up to four visitors are allowed at one time in a patient room. The visitors may rotate out with other people during the day.  One visitor age 67 or older may stay with the patient overnight and must be in the room by 8 p.m.   Merchandiser, Retail to address health-related social needs:  [email or web address]   L?ch ph?u thu?t c?a b?n ???c ln l?ch vo: Th? Ba, ngy 25 thng 11 n?m 2011 Vui lng ??n Qu?y ??ng k t?i T?ng 1 c?a Trung tm Y t?. Sau ?, ??n qu?y ph?u thu?t ? T?ng 2. ?? bi?t gi? ??n, vui lng g?i [phone] t? 13:00 - 15:00 vo Th? Hai, ngy 25 thng 11 n?m 2011. N?u gi? ??n c?a b?n l 6:00 sng, vui lng khng ??n tr??c gi? ? v c?a vo Trung tm Y t? ch? m? c?a lc 6:00 sng.  L?U : Vi?c khng tun th? ??y ?? cc h??ng d?n c th? d?n ??n nguy c? y t? nghim tr?ng, th?m ch t? vong; ho?c theo quy?t ??nh c?a bc s? ph?u thu?t v bc s? gy m, ca ph?u thu?t c?a b?n c th? c?n ph?i d?i l?i.  Khng ?n HO?C u?ng n??c sau n?a ?m tr??c ngy ph?u thu?t.  Khng nhai k?o cao su ho?c k?o c?ng.  M?t tu?n tr??c ph?u thu?t: Ng?ng NGAY (07-04-24) Ng?ng s? d?ng thu?c khng vim (NSAID) nh? Advil , Aleve, Ibuprofen , Motrin , Naproxen, Naprosyn v cc s?n ph?m c thnh ph?n Aspirin nh? Excedrin, Goody's Powder, BC Powder. Ng?ng s? d?ng B?T K? lo?i th?c ph?m b? sung khng k ??n no cho ??n sau ph?u thu?t.  Tuy nhin, b?n c th? ti?p t?c dng Tylenol  n?u c?n gi?m ?au cho ??n ngy ph?u thu?t.  Ti?p t?c dng t?t c? cc lo?i thu?c k ??n khc cho ??n ngy ph?u thu?t.  CH? U?NG CC LO?I THU?C SAU V?I M?T NG?M N??C VO NGY PH?U THU?T: -pantoprazole (PROTONIX)  Khng u?ng r??u bia trong vng 24  gi? tr??c ho?c sau ph?u thu?t.  Khng ht thu?c l, k? c? thu?c l ?i?n t? trong vng 24 gi? tr??c ph?u thu?t.  Khng s? d?ng cc s?n ph?m thu?c l nhai t nh?t 6 gi? tr??c ph?u thu?t.  Khng dng mi?ng dn nicotine trong ngy ph?u thu?t.  Khng s? d?ng b?t k? lo?i thu?c gi?i tr no t nh?t m?t tu?n (t?t nh?t l 2 tu?n) tr??c khi ph?u thu?t. Xin l?u  r?ng s? k?t h?p gi?a cocaine v thu?c gy m c th? gy ra h?u qu? tiu c?c, th?m ch t? vong. N?u xt nghi?m d??ng tnh v?i cocaine, ca ph?u thu?t c?a b?n s? b? h?y b?SABRA  Vo bu?i sng ngy ph?u thu?t, hy ?nh r?ng b?ng kem ?nh r?ng v n??c, b?n c th? [place] mi?ng b?ng n??c [place] mi?ng  n?u mu?n.  Khng nu?t kem ?nh r?ng ho?c n??c [place] mi?ng.  S? d?ng x phng CHG theo h??ng d?n trn t? h??ng d?n.  Khng ?eo trang s?c, trang ?i?m, k?p tc, k?p t?m ho?c s?n mng tay.  ??i v?i trang s?c hn (v?nh vi?n): vng tay, l?c chn, th?t l?ng, v.v. Vui lng tho b? tr??c khi ph?u thu?t. N?u khng tho b?, nhn vin b?nh vi?n c th? s? ph?i c?t b? vo ngy ph?u thu?t.  Khng s? d?ng kem d??ng da, ph?n rm ho?c n??c hoa.  Khng c?o lng c? th? t? c? tr? xu?ng 48 gi? tr??c khi ph?u thu?t.  Khng ???c ?eo knh p trng, my tr? thnh v r?ng gi? trong khi ph?u thu?t.  Khng mang theo ?? c gi tr? ??n b?nh vi?n. [place] khng ch?u trch nhi?m cho b?t k? ?? ??c ho?c v?t d?ng c gi tr? no b? m?t/th?t l?c.  Hy thng bo cho bc s? n?u c b?t k? thay ??i no v? tnh tr?ng s?c kh?e c?a b?n (c?m l?nh, s?t, nhi?m trng).  M?c qu?n o tho?i mi (ph h?p v?i lo?i ph?u thu?t c?a b?n) khi ??n b?nh vi?n.  Sau ph?u thu?t, b?n c th? gip ng?n ng?a cc bi?n ch?ng ph?i b?ng cch th?c hi?n cc bi t?p th?.  Ht th? su v ho m?i 1-2 gi?. Bc s? c th? yu c?u s? d?ng m?t thi?t b? g?i l My ?o h h?p kch thch ?? gip b?n ht th? su.  Khi ho ho?c h?t h?i, hy dng c? hai tay gi? ch?t m?t chi?c g?i ln v?t m?. Ph??ng php ny ???c g?i l n?p. Vi?c ny  gip b?o v? v?t m? v gi?m ?au b?ng.  N?u b?n ph?i nh?p vi?n qua ?m, hy ?? vali trong xe. Sau ph?u thu?t, vali c th? ???c mang ln phng c?a b?n.  Trong tr??ng h?p s? l??ng b?nh nhn t?ng ln, b?n, v?i t? cch l b?nh nhn, c th? c?n ti?p t?c ???c ch?m [address] h?u ph?u t?i khoa Ph?u thu?t trong ngy.  N?u b?n ???c xu?t vi?n vo ngy ph?u thu?t, b?n s? khng ???c php li xe v? nh. B?n s? c?n m?t ng??i c trch nhi?m ??a b?n v? nh v ? l?i v?i b?n trong 24 gi? sau ph?u thu?t.  N?u b?n ?i ph??ng ti?n giao thng cng c?ng, b?n s? c?n m?t ng??i c trch nhi?m ?i cng.  Vui lng g?i cho Phng Xt nghi?m Tr??c khi Nh?p vi?n theo s? [phone] n?u b?n c b?t k? cu h?i no v? cc h??ng d?n ny.  Chnh sch Th?m b?nh Ph?u thu?t:  B?nh nhn ph?u thu?t ho?c th? thu?t ???c php c hai ng??i th?m.  Tr? em d??i 16 tu?i ph?i c ng??i l?n ?i cng nh?ng khng ph?i l b?nh nhn.  Th?m b?nh nhn n?i tr:  Gi? th?m b?nh l t? 7 gi? sng ??n 8 gi? t?i.  T?i ?a b?n ng??i ???c php vo cng m?t phng b?nh nhn. Nh?ng ng??i th?m b?nh c th? thay phin nhau vo ban ngy.  M?t ng??i th?m b?nh t? 16 tu?i tr? ln c th? ? l?i v?i b?nh nhn qua ?m v ph?i c m?t trong phng tr??c 8 gi? t?i.  Th? m?c ti nguyn c?ng ??ng ?? gi?i quy?t cc nhu c?u x h?i lin quan ??n s?c kh?e: [email or web address]

## 2024-07-04 NOTE — Progress Notes (Signed)
 Anesthesia interview done with language line interpreter Alan [phone]

## 2024-07-04 NOTE — Patient Instructions (Addendum)
 Preparing for Surgery with CHLORHEXIDINE  GLUCONATE (CHG) Soap  Chlorhexidine  Gluconate (CHG) Soap  o An antiseptic cleaner that kills germs and bonds with the skin to continue killing germs even after washing  o Used for showering the night before surgery and morning of surgery  Before surgery, you can play an important role by reducing the number of germs on your skin.  CHG (Chlorhexidine  gluconate) soap is an antiseptic cleanser which kills germs and bonds with the skin to continue killing germs even after washing.  Please do not use if you have an allergy to CHG or antibacterial soaps. If your skin becomes reddened/irritated stop using the CHG.  1. Shower the NIGHT BEFORE SURGERY with CHG soap.  2. If you choose to wash your hair, wash your hair first as usual with your normal shampoo.  3. After shampooing, rinse your hair and body thoroughly to remove the shampoo.  4. Use CHG as you would any other liquid soap. You can apply CHG directly to the skin and wash gently with a clean washcloth.  5. Apply the CHG soap to your body only from the neck down. Do not use on open wounds or open sores. Avoid contact with your eyes, ears, mouth, and genitals (private parts). Wash face and genitals (private parts) with your normal soap.  6. Wash thoroughly, paying special attention to the area where your surgery will be performed.  7. Thoroughly rinse your body with warm water.  8. Do not shower/wash with your normal soap after using and rinsing off the CHG soap.  9. Do not use lotions, oils, etc., after showering with CHG.  10. Pat yourself dry with a clean towel.  11. Wear clean pajamas to bed the night before surgery.  12. Place clean sheets on your bed the night of your shower and do not sleep with pets.  13. Do not apply any deodorants/lotions/powders.  14. Please wear clean  clothes to the hospital.  15. Remember to brush your teeth with your regular toothpaste.   Chu?n b? ph?u thu?t v?i x phng CHLORHEXIDINE  GLUCONATE (CHG)  X phng Chlorhexidine  Gluconate (CHG)  o M?t ch?t t?y r?a st trng gip tiu di?t vi khu?n v bm dnh vo da ?? ti?p t?c tiu di?t vi khu?n ngay c? sau khi r?a.  o Dng ?? t?m vo ?m tr??c ph?u thu?t v sng hm ph?u thu?t.  Tr??c khi ph?u thu?t, b?n c th? ?ng m?t vai tr quan tr?ng b?ng cch gi?m s? l??ng vi khu?n trn da.  X phng CHG (Chlorhexidine  Gluconate) l m?t ch?t t?y r?a st trng gip tiu di?t vi khu?n v bm dnh vo da ?? ti?p t?c tiu di?t vi khu?n ngay c? sau khi r?a.  Vui lng khng s? d?ng n?u b?n b? d? ?ng v?i CHG ho?c x phng khng khu?n. N?u da b?n b? ??/kch ?ng, hy ng?ng s? d?ng CHG.  1. T?m vo ?M TR??C KHI PH?U THU?T b?ng x phng CHG.  2. N?u b?n mu?n g?i ??u, hy g?i ??u tr??c b?ng d?u g?i thng th??ng.  3. Sau khi g?i ??u, x? s?ch tc v c? th? ?? lo?i b? d?u g?i.  4. S?  d?ng CHG nh? b?t k? lo?i x phng l?ng no khc. B?n c th? thoa CHG tr?c ti?p ln da v r?a nh? nhng b?ng kh?n m?t s?ch.  5. Ch? thoa x phng CHG ln c? th? t? c? tr? xu?ng. Khng s? d?ng trn v?t th??ng h? ho?c v?t lot h?. Trnh ti?p xc v?i m?t, tai, mi?ng v b? ph?n sinh d?c (vng kn). R?a m?t v b? ph?n sinh d?c (vng kn) b?ng x phng thng th??ng c?a b?n.  6. T?m r?a k? l??ng, ??c bi?t ch  ??n vng s? ph?u thu?t.  7. X? s?ch c? th? b?ng n??c ?m.  8. Khng t?m/g?i ??u b?ng x phng thng th??ng sau khi s? d?ng v x? s?ch x phng CHG.  9. Khng s? d?ng kem d??ng da, d?u, v.v. sau khi t?m CHG.  10. Lau kh b?ng kh?n s?ch.  11. M?c ?? ng? s?ch s? khi ?i ng? vo ?m tr??c khi ph?u thu?t.  12. Tr?i ga tr?i gi??ng s?ch s? vo ?m t?m v khng ng? cng th c?ng.  13. Khng s? d?ng b?t k? ch?t kh? mi/s?a d??ng da/ph?n ph? no.  14. Vui lng m?c qu?n o s?ch khi ??n b?nh vi?n.  15. Nh? ?nh r?ng b?ng kem  ?nh r?ng thng th??ng.

## 2024-07-05 ENCOUNTER — Ambulatory Visit
Admission: RE | Admit: 2024-07-05 | Discharge: 2024-07-05 | Disposition: A | Discharge status: Home / Self Care | Source: Ambulatory Visit | Attending: Urology | Admitting: Urology

## 2024-07-05 ENCOUNTER — Encounter
Admission: RE | Admit: 2024-07-05 | Discharge: 2024-07-05 | Disposition: A | Discharge status: Home / Self Care | Source: Ambulatory Visit | Attending: Urology | Admitting: Urology

## 2024-07-05 DIAGNOSIS — Z01818 Encounter for other preprocedural examination: Secondary | ICD-10-CM | POA: Diagnosis present

## 2024-07-05 DIAGNOSIS — Z01812 Encounter for preprocedural laboratory examination: Secondary | ICD-10-CM | POA: Diagnosis not present

## 2024-07-05 DIAGNOSIS — N2889 Other specified disorders of kidney and ureter: Secondary | ICD-10-CM | POA: Insufficient documentation

## 2024-07-05 LAB — URINALYSIS, COMPLETE (UACMP) WITH MICROSCOPIC
Bacteria, UA: NONE SEEN
Bilirubin Urine: NEGATIVE
Glucose, UA: NEGATIVE mg/dL
Hgb urine dipstick: NEGATIVE
Ketones, ur: NEGATIVE mg/dL
Leukocytes,Ua: NEGATIVE
Nitrite: NEGATIVE
Protein, ur: NEGATIVE mg/dL
Specific Gravity, Urine: 1.013 (ref 1.005–1.030)
Squamous Epithelial / HPF: 0 /HPF (ref 0–5)
pH: 5 (ref 5.0–8.0)

## 2024-07-05 LAB — PROTIME-INR
INR: 1 (ref 0.8–1.2)
Prothrombin Time: 13.3 s (ref 11.4–15.2)

## 2024-07-05 LAB — TYPE AND SCREEN
ABO/RH(D): O POS
Antibody Screen: NEGATIVE

## 2024-07-05 LAB — APTT: aPTT: 35 s (ref 24–36)

## 2024-07-08 LAB — URINE CULTURE

## 2024-07-10 ENCOUNTER — Other Ambulatory Visit: Payer: Self-pay

## 2024-07-10 ENCOUNTER — Encounter: Payer: Self-pay | Admitting: Urology

## 2024-07-10 ENCOUNTER — Encounter: Admission: RE | Disposition: A | Payer: Self-pay | Source: Home / Self Care | Attending: Urology

## 2024-07-10 ENCOUNTER — Inpatient Hospital Stay: Payer: Self-pay | Admitting: Urgent Care

## 2024-07-10 ENCOUNTER — Inpatient Hospital Stay: Payer: Self-pay | Admitting: Certified Registered"

## 2024-07-10 ENCOUNTER — Inpatient Hospital Stay
Admission: RE | Admit: 2024-07-10 | Discharge: 2024-07-11 | DRG: 658 | Disposition: A | Discharge status: Home / Self Care | Attending: Urology | Admitting: Urology

## 2024-07-10 DIAGNOSIS — N2889 Other specified disorders of kidney and ureter: Secondary | ICD-10-CM | POA: Diagnosis present

## 2024-07-10 DIAGNOSIS — Z91013 Allergy to seafood: Secondary | ICD-10-CM | POA: Diagnosis not present

## 2024-07-10 DIAGNOSIS — E7800 Pure hypercholesterolemia, unspecified: Secondary | ICD-10-CM | POA: Diagnosis present

## 2024-07-10 DIAGNOSIS — R7303 Prediabetes: Secondary | ICD-10-CM | POA: Diagnosis present

## 2024-07-10 DIAGNOSIS — E663 Overweight: Secondary | ICD-10-CM | POA: Diagnosis present

## 2024-07-10 DIAGNOSIS — Z860100 Personal history of colon polyps, unspecified: Secondary | ICD-10-CM

## 2024-07-10 DIAGNOSIS — Z6828 Body mass index (BMI) 28.0-28.9, adult: Secondary | ICD-10-CM | POA: Diagnosis not present

## 2024-07-10 DIAGNOSIS — Z79899 Other long term (current) drug therapy: Secondary | ICD-10-CM | POA: Diagnosis not present

## 2024-07-10 DIAGNOSIS — Z01818 Encounter for other preprocedural examination: Secondary | ICD-10-CM

## 2024-07-10 DIAGNOSIS — C641 Malignant neoplasm of right kidney, except renal pelvis: Secondary | ICD-10-CM | POA: Diagnosis present

## 2024-07-10 HISTORY — PX: ROBOTIC ASSITED PARTIAL NEPHRECTOMY: SHX6087

## 2024-07-10 LAB — CBC
HCT: 43.2 % (ref 36.0–46.0)
Hemoglobin: 14.5 g/dL (ref 12.0–15.0)
MCH: 31.3 pg (ref 26.0–34.0)
MCHC: 33.6 g/dL (ref 30.0–36.0)
MCV: 93.3 fL (ref 80.0–100.0)
Platelets: 255 K/uL (ref 150–400)
RBC: 4.63 MIL/uL (ref 3.87–5.11)
RDW: 12.6 % (ref 11.5–15.5)
WBC: 17.1 K/uL — ABNORMAL HIGH (ref 4.0–10.5)
nRBC: 0 % (ref 0.0–0.2)

## 2024-07-10 LAB — POCT PREGNANCY, URINE: Preg Test, Ur: NEGATIVE

## 2024-07-10 LAB — ABO/RH: ABO/RH(D): O POS

## 2024-07-10 SURGERY — NEPHRECTOMY, PARTIAL, ROBOT-ASSISTED
Anesthesia: General | Laterality: Right

## 2024-07-10 MED ORDER — CEFAZOLIN SODIUM-DEXTROSE 2-4 GM/100ML-% IV SOLN
2.0000 g | INTRAVENOUS | Status: AC
  Administered 2024-07-10: 2 g via INTRAVENOUS

## 2024-07-10 MED ORDER — LIDOCAINE HCL (CARDIAC) PF 100 MG/5ML IV SOSY
PREFILLED_SYRINGE | INTRAVENOUS | Status: DC | PRN
  Administered 2024-07-10: 80 mg via INTRAVENOUS

## 2024-07-10 MED ORDER — PROPOFOL 1000 MG/100ML IV EMUL
INTRAVENOUS | Status: AC
  Filled 2024-07-10: qty 100

## 2024-07-10 MED ORDER — ALBUMIN HUMAN 5 % IV SOLN: INTRAVENOUS | Status: DC | PRN

## 2024-07-10 MED ORDER — DEXMEDETOMIDINE HCL IN NACL 80 MCG/20ML IV SOLN
INTRAVENOUS | Status: DC | PRN
  Administered 2024-07-10: 4 ug via INTRAVENOUS

## 2024-07-10 MED ORDER — ONDANSETRON HCL 4 MG/2ML IJ SOLN
INTRAMUSCULAR | Status: AC
  Filled 2024-07-10: qty 2

## 2024-07-10 MED ORDER — CHLORHEXIDINE GLUCONATE 4 % EX SOLN: 60.0000 mL | Freq: Once | CUTANEOUS | Status: DC

## 2024-07-10 MED ORDER — HEMOSTATIC AGENTS (NO CHARGE) OPTIME
TOPICAL | Status: DC | PRN
  Administered 2024-07-10 (×2): 1 via TOPICAL

## 2024-07-10 MED ORDER — LACTATED RINGERS IV SOLN: INTRAVENOUS | Status: DC

## 2024-07-10 MED ORDER — PHENYLEPHRINE 80 MCG/ML (10ML) SYRINGE FOR IV PUSH (FOR BLOOD PRESSURE SUPPORT)
PREFILLED_SYRINGE | INTRAVENOUS | Status: DC | PRN
  Administered 2024-07-10: 80 ug via INTRAVENOUS

## 2024-07-10 MED ORDER — ACETAMINOPHEN 500 MG PO TABS
1000.0000 mg | ORAL_TABLET | Freq: Once | ORAL | Status: AC
  Administered 2024-07-10: 1000 mg via ORAL

## 2024-07-10 MED ORDER — MIDAZOLAM HCL (PF) 2 MG/2ML IJ SOLN
INTRAMUSCULAR | Status: DC | PRN
  Administered 2024-07-10: 2 mg via INTRAVENOUS

## 2024-07-10 MED ORDER — FENTANYL CITRATE (PF) 100 MCG/2ML IJ SOLN
INTRAMUSCULAR | Status: AC
  Filled 2024-07-10: qty 2

## 2024-07-10 MED ORDER — OXYCODONE HCL 5 MG PO TABS: 5.0000 mg | ORAL_TABLET | ORAL | Status: DC | PRN

## 2024-07-10 MED ORDER — OXYCODONE HCL 5 MG/5ML PO SOLN: 5.0000 mg | Freq: Once | ORAL | Status: AC | PRN

## 2024-07-10 MED ORDER — LIDOCAINE HCL (PF) 2 % IJ SOLN
INTRAMUSCULAR | Status: AC
  Filled 2024-07-10: qty 5

## 2024-07-10 MED ORDER — OXYCODONE HCL 5 MG PO TABS
ORAL_TABLET | ORAL | Status: AC
  Filled 2024-07-10: qty 1

## 2024-07-10 MED ORDER — SUGAMMADEX SODIUM 200 MG/2ML IV SOLN
INTRAVENOUS | Status: DC | PRN
  Administered 2024-07-10: 300 mg via INTRAVENOUS

## 2024-07-10 MED ORDER — LACTATED RINGERS IV SOLN: INTRAVENOUS | Status: DC | PRN

## 2024-07-10 MED ORDER — ORAL CARE MOUTH RINSE
15.0000 mL | Freq: Once | OROMUCOSAL | Status: AC
Start: 2024-07-10 — End: 2024-07-10

## 2024-07-10 MED ORDER — MIDAZOLAM HCL 2 MG/2ML IJ SOLN
INTRAMUSCULAR | Status: AC
  Filled 2024-07-10: qty 2

## 2024-07-10 MED ORDER — KETAMINE HCL 50 MG/5ML IJ SOSY
PREFILLED_SYRINGE | INTRAMUSCULAR | Status: DC | PRN
  Administered 2024-07-10: 20 mg via INTRAVENOUS
  Administered 2024-07-10: 10 mg via INTRAVENOUS
  Administered 2024-07-10: 20 mg via INTRAVENOUS

## 2024-07-10 MED ORDER — BUPIVACAINE LIPOSOME 1.3 % IJ SUSP
INTRAMUSCULAR | Status: DC | PRN
Start: 2024-07-10 — End: 2024-07-10
  Administered 2024-07-10: 40 mL

## 2024-07-10 MED ORDER — GLYCOPYRROLATE 0.2 MG/ML IJ SOLN
INTRAMUSCULAR | Status: DC | PRN
  Administered 2024-07-10: .2 mg via INTRAVENOUS

## 2024-07-10 MED ORDER — BUPIVACAINE LIPOSOME 1.3 % IJ SUSP
INTRAMUSCULAR | Status: AC
  Filled 2024-07-10: qty 20

## 2024-07-10 MED ORDER — SENNA 8.6 MG PO TABS
1.0000 | ORAL_TABLET | Freq: Two times a day (BID) | ORAL | Status: DC
  Administered 2024-07-10 – 2024-07-11 (×2): 8.6 mg via ORAL
  Filled 2024-07-10 (×2): qty 1

## 2024-07-10 MED ORDER — GABAPENTIN 300 MG PO CAPS
300.0000 mg | ORAL_CAPSULE | Freq: Once | ORAL | Status: AC
  Administered 2024-07-10: 300 mg via ORAL

## 2024-07-10 MED ORDER — OXYCODONE HCL 5 MG PO TABS
5.0000 mg | ORAL_TABLET | ORAL | Status: DC | PRN
  Administered 2024-07-10: 5 mg via ORAL
  Filled 2024-07-10: qty 1

## 2024-07-10 MED ORDER — ACETAMINOPHEN 500 MG PO TABS
ORAL_TABLET | ORAL | Status: AC
  Filled 2024-07-10: qty 2

## 2024-07-10 MED ORDER — ONDANSETRON HCL 4 MG/2ML IJ SOLN
INTRAMUSCULAR | Status: DC | PRN
Start: 2024-07-10 — End: 2024-07-10
  Administered 2024-07-10: 4 mg via INTRAVENOUS

## 2024-07-10 MED ORDER — CEFAZOLIN SODIUM-DEXTROSE 2-4 GM/100ML-% IV SOLN
INTRAVENOUS | Status: AC
  Filled 2024-07-10: qty 100

## 2024-07-10 MED ORDER — FENTANYL CITRATE (PF) 100 MCG/2ML IJ SOLN
25.0000 ug | INTRAMUSCULAR | Status: DC | PRN
  Administered 2024-07-10 (×2): 25 ug via INTRAVENOUS

## 2024-07-10 MED ORDER — FENTANYL CITRATE (PF) 100 MCG/2ML IJ SOLN
INTRAMUSCULAR | Status: DC | PRN
  Administered 2024-07-10 (×2): 50 ug via INTRAVENOUS

## 2024-07-10 MED ORDER — PROPOFOL 500 MG/50ML IV EMUL
INTRAVENOUS | Status: DC | PRN
  Administered 2024-07-10: 125 ug/kg/min via INTRAVENOUS

## 2024-07-10 MED ORDER — PHENYLEPHRINE 80 MCG/ML (10ML) SYRINGE FOR IV PUSH (FOR BLOOD PRESSURE SUPPORT)
PREFILLED_SYRINGE | INTRAVENOUS | Status: AC
  Filled 2024-07-10: qty 10

## 2024-07-10 MED ORDER — ROCURONIUM BROMIDE 10 MG/ML (PF) SYRINGE
PREFILLED_SYRINGE | INTRAVENOUS | Status: AC
  Filled 2024-07-10: qty 10

## 2024-07-10 MED ORDER — PHENYLEPHRINE HCL-NACL 20-0.9 MG/250ML-% IV SOLN
INTRAVENOUS | Status: DC | PRN
  Administered 2024-07-10: 20 ug/min via INTRAVENOUS

## 2024-07-10 MED ORDER — ROCURONIUM BROMIDE 100 MG/10ML IV SOLN
INTRAVENOUS | Status: DC | PRN
  Administered 2024-07-10: 30 mg via INTRAVENOUS
  Administered 2024-07-10: 60 mg via INTRAVENOUS

## 2024-07-10 MED ORDER — CHLORHEXIDINE GLUCONATE 0.12 % MT SOLN
15.0000 mL | Freq: Once | OROMUCOSAL | Status: AC
  Administered 2024-07-10: 15 mL via OROMUCOSAL

## 2024-07-10 MED ORDER — STERILE WATER FOR IRRIGATION IR SOLN
Status: DC | PRN
  Administered 2024-07-10: 500 mL
  Administered 2024-07-10: 1000 mL

## 2024-07-10 MED ORDER — GLYCOPYRROLATE 0.2 MG/ML IJ SOLN
INTRAMUSCULAR | Status: AC
  Filled 2024-07-10: qty 1

## 2024-07-10 MED ORDER — ONDANSETRON HCL 4 MG/2ML IJ SOLN: 4.0000 mg | INTRAMUSCULAR | Status: DC | PRN

## 2024-07-10 MED ORDER — BUPIVACAINE HCL (PF) 0.25 % IJ SOLN
INTRAMUSCULAR | Status: AC
  Filled 2024-07-10: qty 30

## 2024-07-10 MED ORDER — KETAMINE HCL 50 MG/5ML IJ SOSY
PREFILLED_SYRINGE | INTRAMUSCULAR | Status: AC
  Filled 2024-07-10: qty 5

## 2024-07-10 MED ORDER — PROPOFOL 10 MG/ML IV BOLUS
INTRAVENOUS | Status: DC | PRN
  Administered 2024-07-10: 180 mg via INTRAVENOUS

## 2024-07-10 MED ORDER — SURGIFLO WITH THROMBIN (HEMOSTATIC MATRIX KIT) OPTIME
TOPICAL | Status: DC | PRN
Start: 2024-07-10 — End: 2024-07-10
  Administered 2024-07-10: 1 via TOPICAL

## 2024-07-10 MED ORDER — GABAPENTIN 300 MG PO CAPS
ORAL_CAPSULE | ORAL | Status: AC
  Filled 2024-07-10: qty 1

## 2024-07-10 MED ORDER — OXYCODONE HCL 5 MG PO TABS
5.0000 mg | ORAL_TABLET | Freq: Once | ORAL | Status: AC | PRN
  Administered 2024-07-10: 5 mg via ORAL

## 2024-07-10 MED ORDER — DEXAMETHASONE SOD PHOSPHATE PF 10 MG/ML IJ SOLN
INTRAMUSCULAR | Status: DC | PRN
  Administered 2024-07-10: 10 mg via INTRAVENOUS

## 2024-07-10 MED ORDER — DROPERIDOL 2.5 MG/ML IJ SOLN: 0.6250 mg | Freq: Once | INTRAMUSCULAR | Status: DC | PRN

## 2024-07-10 MED ORDER — ACETAMINOPHEN 10 MG/ML IV SOLN: 1000.0000 mg | Freq: Once | INTRAVENOUS | Status: DC | PRN

## 2024-07-10 MED ORDER — CHLORHEXIDINE GLUCONATE 0.12 % MT SOLN
OROMUCOSAL | Status: AC
  Filled 2024-07-10: qty 15

## 2024-07-10 MED ORDER — DEXTROSE-SODIUM CHLORIDE 5-0.45 % IV SOLN: INTRAVENOUS | Status: AC

## 2024-07-10 MED ORDER — ACETAMINOPHEN 500 MG PO TABS
1000.0000 mg | ORAL_TABLET | Freq: Four times a day (QID) | ORAL | Status: AC
  Administered 2024-07-10 – 2024-07-11 (×4): 1000 mg via ORAL
  Filled 2024-07-10 (×3): qty 2

## 2024-07-10 MED ORDER — PROPOFOL 10 MG/ML IV BOLUS
INTRAVENOUS | Status: AC
  Filled 2024-07-10: qty 20

## 2024-07-10 SURGICAL SUPPLY — 55 items
ANCHOR TIS RET SYS 235ML (MISCELLANEOUS) IMPLANT
APPLICATOR ARISTA FLEXITIP XL (MISCELLANEOUS) ×1 IMPLANT
APPLICATOR SURGIFLO ENDO (HEMOSTASIS) ×1 IMPLANT
CLIP LIGATING HEM O LOK PURPLE (MISCELLANEOUS) ×4 IMPLANT
COVER TIP SHEARS 8 DVNC (MISCELLANEOUS) ×1 IMPLANT
DEFOGGER SCOPE WARM SEASHARP (MISCELLANEOUS) ×2 IMPLANT
DERMABOND ADVANCED .7 DNX12 (GAUZE/BANDAGES/DRESSINGS) ×2 IMPLANT
DRAPE ARM DVNC X/XI (DISPOSABLE) ×4 IMPLANT
DRAPE COLUMN DVNC XI (DISPOSABLE) ×1 IMPLANT
DRAPE INCISE IOBAN 66X45 STRL (DRAPES) IMPLANT
DRESSING SURGICEL FIBRLLR 1X2 (HEMOSTASIS) IMPLANT
DRIVER NDL LRG 8 DVNC XI (INSTRUMENTS) ×1 IMPLANT
DRIVER NDLE LRG 8 DVNC XI (INSTRUMENTS) ×2 IMPLANT
ELECTRODE REM PT RTRN 9FT ADLT (ELECTROSURGICAL) ×1 IMPLANT
FORCEPS BPLR 8 MD DVNC XI (FORCEP) ×1 IMPLANT
FORCEPS BPLR FENES DVNC XI (FORCEP) ×1 IMPLANT
GLOVE BIOGEL PI IND STRL 7.0 (GLOVE) ×2 IMPLANT
GLOVE BIOGEL PI IND STRL 7.5 (GLOVE) ×1 IMPLANT
GOWN STRL REUS W/ TWL LRG LVL3 (GOWN DISPOSABLE) ×3 IMPLANT
GOWN STRL REUS W/ TWL XL LVL3 (GOWN DISPOSABLE) ×1 IMPLANT
GRASPER SUT TROCAR 14GX15 (MISCELLANEOUS) ×1 IMPLANT
GRASPER TIP-UP FEN DVNC XI (INSTRUMENTS) ×1 IMPLANT
HEMOSTAT ARISTA ABSORB 1G (HEMOSTASIS) ×1 IMPLANT
HEMOSTAT SURGICEL 2X14 (HEMOSTASIS) IMPLANT
IRRIGATION STRYKERFLOW (MISCELLANEOUS) ×1 IMPLANT
KIT PINK PAD W/HEAD ARM REST (MISCELLANEOUS) ×1 IMPLANT
KIT TURNOVER KIT A (KITS) ×1 IMPLANT
KITTNER LAPARASCOPIC 5X40 (MISCELLANEOUS) ×1 IMPLANT
LABEL OR SOLS (LABEL) ×1 IMPLANT
MANIFOLD NEPTUNE II (INSTRUMENTS) ×1 IMPLANT
NDL HYPO 22X1.5 SAFETY MO (MISCELLANEOUS) IMPLANT
NDL INSUFFLATION 14GA 120MM (NEEDLE) ×1 IMPLANT
NEEDLE HYPO 22X1.5 SAFETY MO (MISCELLANEOUS) ×1 IMPLANT
NEEDLE INSUFFLATION 14GA 120MM (NEEDLE) ×1 IMPLANT
OBTURATOR OPTICALSTD 8 DVNC (TROCAR) ×1 IMPLANT
PACK LAP CHOLECYSTECTOMY (MISCELLANEOUS) ×1 IMPLANT
PORT ACCESS TROCAR AIRSEAL 12 (TROCAR) ×1 IMPLANT
PROBE ALOKA ALPHA6 ROB DROP IN (MISCELLANEOUS) ×1 IMPLANT
SCISSORS MNPLR CVD DVNC XI (INSTRUMENTS) ×1 IMPLANT
SEAL UNIV 5-12 XI (MISCELLANEOUS) ×4 IMPLANT
SET TRI-LUMEN FLTR TB AIRSEAL (TUBING) ×1 IMPLANT
SOLUTION ELECTROSURG ANTI STCK (MISCELLANEOUS) ×1 IMPLANT
SPONGE T-LAP 4X18 ~~LOC~~+RFID (SPONGE) ×1 IMPLANT
STRAP SAFETY 5IN WIDE (MISCELLANEOUS) ×3 IMPLANT
SURGIFLO W/THROMBIN 8M KIT (HEMOSTASIS) ×1 IMPLANT
SUT MNCRL AB 4-0 PS2 18 (SUTURE) ×2 IMPLANT
SUT VIC AB 0 CT1 36 (SUTURE) ×4 IMPLANT
SUT VIC AB 4-0 SH 27XANBCTRL (SUTURE) ×4 IMPLANT
SUT VICRYL 0 UR6 27IN ABS (SUTURE) ×2 IMPLANT
TAPE CLOTH 3X10 WHT NS LF (GAUZE/BANDAGES/DRESSINGS) ×2 IMPLANT
TRAY FOLEY SLVR 16FR LF STAT (SET/KITS/TRAYS/PACK) ×1 IMPLANT
TROCAR Z THRD FIOS 12X150 (TROCAR) ×1 IMPLANT
TROCAR Z-THREAD FIOS 5X100MM (TROCAR) ×1 IMPLANT
WATER STERILE IRR 3000ML UROMA (IV SOLUTION) ×1 IMPLANT
WATER STERILE IRR 500ML POUR (IV SOLUTION) ×1 IMPLANT

## 2024-07-10 NOTE — Anesthesia Preprocedure Evaluation (Addendum)
 Anesthesia Evaluation  Patient identified by MRN, date of birth, ID band Patient awake    Reviewed: Allergy & Precautions, H&P , NPO status , Patient's Chart, lab work & pertinent test results  Airway Mallampati: II  TM Distance: >3 FB Neck ROM: full    Dental no notable dental hx.    Pulmonary neg pulmonary ROS   Pulmonary exam normal        Cardiovascular negative cardio ROS Normal cardiovascular exam     Neuro/Psych negative neurological ROS  negative psych ROS   GI/Hepatic negative GI ROS, Neg liver ROS,,,  Endo/Other  negative endocrine ROS    Renal/GU Renal diseaseRight renal mass 1.7 cm mass in right kidney      Musculoskeletal   Abdominal Normal abdominal exam  (+)   Peds  Hematology negative hematology ROS (+)   Anesthesia Other Findings Past Medical History: No date: Bladder stone No date: GERD (gastroesophageal reflux disease) No date: Hyperlipidemia No date: Kidney lesion No date: Pre-diabetes  Past Surgical History: No date: ABDOMINOPLASTY No date: AUGMENTATION MAMMAPLASTY No date: CESAREAN SECTION 11/30/2017: CHOLECYSTECTOMY; N/A     Comment:  Procedure: LAPAROSCOPIC CHOLECYSTECTOMY;  Surgeon:               Nicholaus Selinda Birmingham, MD;  Location: [facility] ORS;  Service:               General;  Laterality: N/A; 05/29/2024: COLONOSCOPY; N/A     Comment:  Procedure: COLONOSCOPY;  Surgeon: Jinny Carmine, MD;                Location: [facility] ENDOSCOPY;  Service: Endoscopy;                Laterality: N/A; 2006: COSMETIC SURGERY     Reproductive/Obstetrics negative OB ROS                              Anesthesia Physical Anesthesia Plan  ASA: 3  Anesthesia Plan: General ETT   Post-op Pain Management: Tylenol  PO (pre-op)*, Ketamine IV* and Gabapentin PO (pre-op)*   Induction: Intravenous  PONV Risk Score and Plan: 3 and Ondansetron , Dexamethasone  and Midazolam   Airway  Management Planned: Oral ETT  Additional Equipment:   Intra-op Plan:   Post-operative Plan: Extubation in OR  Informed Consent: I have reviewed the patients History and Physical, chart, labs and discussed the procedure including the risks, benefits and alternatives for the proposed anesthesia with the patient or authorized representative who has indicated his/her understanding and acceptance.     Dental Advisory Given  Plan Discussed with: CRNA and Surgeon  Anesthesia Plan Comments:          Anesthesia Quick Evaluation

## 2024-07-10 NOTE — Plan of Care (Signed)
  Problem: Skin Integrity: Goal: Demonstration of wound healing without infection will improve Outcome: Progressing   

## 2024-07-10 NOTE — Anesthesia Postprocedure Evaluation (Signed)
 Anesthesia Post Note  Patient: Megan Knight  Procedure(s) Performed: NEPHRECTOMY, PARTIAL, ROBOT-ASSISTED (Right)  Patient location during evaluation: PACU Anesthesia Type: General Level of consciousness: awake and alert Pain management: pain level controlled Vital Signs Assessment: post-procedure vital signs reviewed and stable Respiratory status: spontaneous breathing, nonlabored ventilation and respiratory function stable Cardiovascular status: blood pressure returned to baseline and stable Postop Assessment: no apparent nausea or vomiting Anesthetic complications: no   No notable events documented.   Last Vitals:  Vitals:   07/10/24 1530 07/10/24 1545  BP: 114/72 108/67  Pulse: 60 61  Resp: 13 15  Temp:    SpO2: 99% 100%    Last Pain:  Vitals:   07/10/24 1537  TempSrc:   PainSc: 4                  Camellia Merilee Louder

## 2024-07-10 NOTE — Op Note (Signed)
 Date of procedure: 07/10/24  Preoperative diagnosis:  Right renal mass   Postoperative diagnosis:  Right renal mass   Procedure: Right robotic partial nephrectomy   Surgeon: Penne Skye, MD  Anesthesia: General  Complications: None  Intraoperative findings:  Successful off-clamp partial nephrectomy of RLP exophytic renal mass.   EBL: 30cc  Specimens:  Right renal mass  Drains: None  Indication: Megan Knight is a 48 y.o. patient with a ~2cm Right lower pole enhancing renal mass.  After reviewing the management options for treatment, they elected to proceed with the above surgical procedure(s). We have discussed the potential benefits and risks of the procedure, side effects of the proposed treatment, the likelihood of the patient achieving the goals of the procedure, and any potential problems that might occur during the procedure or recuperation. Informed consent has been obtained.  Description of procedure: Following successful induction, patient was prepped and draped in usual standard fashion.  IV antibiotics were administered.  Patient was positioned in modified right flank lateral.  A final presurgical timeout was performed with all team members present.   We began with Veress entry at the right upper quadrant followed by pneumoperitoneum to 15 mmHg.  We entered the skin adjacent to our Veress entry with a 5 mm Optiview port and 0 degree lap camera.  The abdomen was entered with direct visualization without complication.  No intraabdominal adhesions or injuries were noted. Next we placed our robotic ports under direct visualization, with a soft J from the right upper quadrant towards the right ASIS.  At this point the robot was docked and instruments inserted.   We began by reflecting the ascending colon medially exposing the retroperitoneum and right kidney.  The duodenum was kocherized and we were able to identify the right renal vein.  Posterior to the vein, we dissected  the anterior surface of the artery for identification although we did not clamp.  Using a drop-in BK ultrasound probe we scanned the right lower pole and easily identified the target renal mass.  At this point we incised Gerota's fascia exposing the renal parenchyma and the index lesion.  The renal mass was partially cystic as well as partially solid towards the base.  The mass approximated 2 cm and was generally exophytic and well-circumscribed.  At this point we began to perform an off clamp partial nephrectomy.  Prior to resection, we brought in 3 separate 0 Vicryl stitches with sliding Weck clips.  Next we began resection, the mass was fully resected with grossly negative margins and intact cystic structure.  We quickly transitioned to bilateral needle drivers.  Two open renal sinuses were closed with 4-0 Vicryl figure-of-eight stitches.  Next we threw three capsular stitches with our 0 Vicryl, and performed a sliding clip renorrhaphy, clips on both ends.  Prior to full synching we inserted a small Surgicel bolster with fibrillar.  Hemostasis was excellent.  At this point we placed our specimen in an Endo Catch bag.  Gerota's fascia was reapproximated with several laparoscopic Weck clips.  All instruments were removed and the robot was undocked.   A 12 mm assistant port was closed with 0 Vicryl stitches which had been passed using a Carter-Thomason.  The remainder of the 8 mm ports were closed with 4-0 Monocryl followed by Dermabond.  This concluded the procedure, patient tolerated well and was transferred to the PACU in excellent condition.   Disposition: Stable to PACU  Plan: - admit for overnight observation, PACU labs  -  Clear diet tonight, ADAT w/ lab work in AM - DC foley in AM  - Anticipate discharge tomorrow lunchtime if clinically well  Penne Skye, MD

## 2024-07-10 NOTE — Progress Notes (Signed)
 Post op check   Patient still in PACU, waiting for bed assignment Awake, minimal discomfort Vitals wnl  Incisions c/d/I, foley in place with light yellow  CBC not quite drawn, pending  I called and spoke to her daughter Lauraine earlier this afternoon Surgery went very well, expect Dc tomorrow AM

## 2024-07-10 NOTE — Anesthesia Procedure Notes (Signed)
 Procedure Name: Intubation Date/Time: 07/10/2024 11:27 AM  Performed by: Stellah Donovan, CRNAPre-anesthesia Checklist: Patient identified, Emergency Drugs available, Suction available and Patient being monitored Patient Re-evaluated:Patient Re-evaluated prior to induction Oxygen Delivery Method: Circle System Utilized Preoxygenation: Pre-oxygenation with 100% oxygen Induction Type: IV induction Ventilation: Mask ventilation without difficulty Laryngoscope Size: Mac and 3 Grade View: Grade I Tube type: Oral Tube size: 7.0 mm Number of attempts: 1 Airway Equipment and Method: Stylet and Oral airway Placement Confirmation: ETT inserted through vocal cords under direct vision, positive ETCO2 and breath sounds checked- equal and bilateral Secured at: 21 cm Tube secured with: Tape Dental Injury: Teeth and Oropharynx as per pre-operative assessment  Comments: Easy, atraumatic intubation, Lips, tongue and teeth unchanged. Head and neck midline.

## 2024-07-10 NOTE — Transfer of Care (Signed)
 Immediate Anesthesia Transfer of Care Note  Patient: Megan Knight  Procedure(s) Performed: NEPHRECTOMY, PARTIAL, ROBOT-ASSISTED (Right)  Patient Location: PACU  Anesthesia Type:General  Level of Consciousness: drowsy  Airway & Oxygen Therapy: Patient Spontanous Breathing and Patient connected to face mask oxygen  Post-op Assessment: Report given to RN and Post -op Vital signs reviewed and stable  Post vital signs: Reviewed and stable  Last Vitals:  Vitals Value Taken Time  BP    Temp    Pulse    Resp    SpO2      Last Pain:  Vitals:   07/10/24 1010  PainSc: 0-No pain         Complications: No notable events documented.

## 2024-07-10 NOTE — Interval H&P Note (Signed)
 History and Physical Interval Note:  07/10/2024 11:02 AM  Megan Knight  has presented today for surgery, with the diagnosis of Right Kidney Mass.  The various methods of treatment have been discussed with the patient and family. After consideration of risks, benefits and other options for treatment, the patient has consented to  Procedure(s): NEPHRECTOMY, PARTIAL, ROBOT-ASSISTED (Right) as a surgical intervention.  The patient's history has been reviewed, patient examined, no change in status, stable for surgery.  I have reviewed the patient's chart and labs.  Questions were answered to the patient's satisfaction.    Cardiac: RRR Lungs: CTA bilaterally  Laterality: Right Procedure: Right robotic partial nephrectomy  Informed consent obtained, we specifically discussed the risks of bleeding, infection, post-operative pain, need for additional procedures. Operative site appropriately marked where indicated  Megan JONELLE Skye, MD 07/10/2024   Megan Knight

## 2024-07-11 ENCOUNTER — Encounter: Payer: Self-pay | Admitting: Urology

## 2024-07-11 DIAGNOSIS — N2889 Other specified disorders of kidney and ureter: Secondary | ICD-10-CM

## 2024-07-11 LAB — CBC
HCT: 41.7 % (ref 36.0–46.0)
Hemoglobin: 14.2 g/dL (ref 12.0–15.0)
MCH: 30.9 pg (ref 26.0–34.0)
MCHC: 34.1 g/dL (ref 30.0–36.0)
MCV: 90.7 fL (ref 80.0–100.0)
Platelets: 294 K/uL (ref 150–400)
RBC: 4.6 MIL/uL (ref 3.87–5.11)
RDW: 12.2 % (ref 11.5–15.5)
WBC: 14.5 K/uL — ABNORMAL HIGH (ref 4.0–10.5)
nRBC: 0 % (ref 0.0–0.2)

## 2024-07-11 LAB — BASIC METABOLIC PANEL WITH GFR
Anion gap: 11 (ref 5–15)
BUN: 6 mg/dL (ref 6–20)
CO2: 24 mmol/L (ref 22–32)
Calcium: 9.1 mg/dL (ref 8.9–10.3)
Chloride: 104 mmol/L (ref 98–111)
Creatinine, Ser: 0.49 mg/dL (ref 0.44–1.00)
GFR, Estimated: >60 mL/min (ref >60)
Glucose, Bld: 138 mg/dL — ABNORMAL HIGH (ref 70–99)
Potassium: 3.7 mmol/L (ref 3.5–5.1)
Sodium: 138 mmol/L (ref 135–145)

## 2024-07-11 MED ORDER — OXYCODONE-ACETAMINOPHEN 5-325 MG PO TABS: 1.0000 | ORAL_TABLET | Freq: Four times a day (QID) | ORAL | 0 refills | Status: AC | PRN

## 2024-07-11 MED ORDER — DOCUSATE SODIUM 100 MG PO CAPS: 100.0000 mg | ORAL_CAPSULE | Freq: Two times a day (BID) | ORAL | 0 refills | Status: DC

## 2024-07-11 NOTE — Plan of Care (Signed)
  Problem: Respiratory: Goal: Ability to achieve and maintain a regular respiratory rate will improve Outcome: Progressing   Problem: Skin Integrity: Goal: Demonstration of wound healing without infection will improve Outcome: Progressing

## 2024-07-11 NOTE — Progress Notes (Signed)
 DISCHARGE NOTE:  Pt and daughter Lauraine given discharged instructions and the both verbalized understanding, all questioned answered. Pt wheeled to car by staff, daughter providing transportation home.

## 2024-07-11 NOTE — Discharge Summary (Signed)
 Date of admission: 07/10/2024  Date of discharge: 07/11/2024  Admission diagnosis: Right renal mass  Discharge diagnosis: Same as above  Secondary diagnoses:  Patient Active Problem List   Diagnosis Date Noted   Renal mass, right 06/20/2024   Polyp of ascending colon 05/29/2024   Prediabetes 02/01/2023   Pure hypercholesterolemia 02/01/2023   Overweight with body mass index (BMI) of 28 to 28.9 in adult 01/21/2022   Encounter for screening colonoscopy 01/14/2020   History and Physical: For full details, please see admission history and physical. Briefly, Megan Knight is a 48 y.o. year old patient admitted on 07/10/2024 for scheduled robotic right partial nephrectomy with Dr. Georganne for management of a right lower pole exophytic renal mass.   A.m. labs with stable hemoglobin, 14.2; decreased white count, 14.5; and stable creatinine, 0.49.  She has been afebrile, VSS.  This morning she reports her pain is reasonably well-controlled.  Foley catheter was removed this morning and she has been able to void.  She has ambulated without difficulty.  I subsequently advanced her diet and she tolerated breakfast without nausea or vomiting.  Physical Exam: Constitutional:  Alert and oriented, no acute distress, nontoxic appearing HEENT: [place], AT Cardiovascular: No clubbing, cyanosis, or edema Respiratory: Normal respiratory effort, no increased work of breathing GI: Abdomen is soft with appropriate postoperative tenderness, no rigidity or rebound. Incisions clean/dry/intact with overlying surgical adhesive. Skin: No rashes, bruises or suspicious lesions Neurologic: Grossly intact, no focal deficits, moving all 4 extremities Psychiatric: Normal mood and affect   Hospital Course: Patient tolerated the procedure well.  She was then transferred to the floor after an uneventful PACU stay.  Her hospital course was uncomplicated.  On POD#1 she had met discharge criteria: was eating a regular diet, was up  and ambulating independently,  pain was well controlled, was voiding without a catheter, and was ready for discharge.  Laboratory values:  Recent Labs    07/10/24 1555 07/11/24 0556  WBC 17.1* 14.5*  HGB 14.5 14.2  HCT 43.2 41.7   Recent Labs    07/11/24 0556  NA 138  K 3.7  CL 104  CO2 24  GLUCOSE 138*  BUN 6  CREATININE 0.49  CALCIUM  9.1   Results for orders placed or performed during the hospital encounter of 07/05/24  Urine Culture     Status: Abnormal   Collection Time: 07/05/24  1:35 PM   Specimen: Urine, Clean Catch  Result Value Ref Range Status   Specimen Description   Final    URINE, CLEAN CATCH Performed at [facility], [address], [place], [place] [address]    Special Requests   Final    NONE Performed at [facility], [address] [place], [place] [address]    Culture MULTIPLE SPECIES PRESENT, SUGGEST RECOLLECTION (A)  Final   Report Status 07/08/2024 FINAL  Final   Disposition: Home  Discharge instruction: The patient was instructed to be ambulatory but told to refrain from heavy lifting, strenuous activity, or driving.   Discharge medications:  Allergies as of 07/11/2024       Reactions   Shellfish Allergy Hives        Medication List     TAKE these medications    atorvastatin  10 MG tablet Commonly known as: LIPITOR TAKE 1 TABLET(10 MG) BY MOUTH DAILY   cetirizine 10 MG tablet Commonly known as: ZYRTEC Take 10 mg by mouth daily as needed for allergies.   diphenhydrAMINE 25 MG tablet  Commonly known as: BENADRYL Take 25 mg by mouth every 6 (six) hours as needed for itching.   docusate sodium  100 MG capsule Commonly known as: Colace Take 1 capsule (100 mg total) by mouth 2 (two) times daily.   oxyCODONE -acetaminophen  5-325 MG tablet Commonly known as: Percocet Take 1 tablet by mouth every 6 (six) hours as needed for up to 5 days for severe pain (pain score 7-10).   pantoprazole 40 MG tablet Commonly  known as: Protonix Take 1 tablet (40 mg total) by mouth daily. What changed: when to take this   sucralfate 1 g tablet Commonly known as: Carafate Take 1 tablet (1 g total) by mouth 4 (four) times daily for 15 days.        Followup:   Follow-up Information     Dr. Georganne. Go on 08/13/2024.   Why: For postop follow-up Contact information: [facility] [place] [address] [place],  [place]  [address]

## 2024-07-17 LAB — SURGICAL PATHOLOGY

## 2024-08-09 DIAGNOSIS — C649 Malignant neoplasm of unspecified kidney, except renal pelvis: Secondary | ICD-10-CM | POA: Insufficient documentation

## 2024-08-09 DIAGNOSIS — Z85528 Personal history of other malignant neoplasm of kidney: Secondary | ICD-10-CM | POA: Insufficient documentation

## 2024-08-09 NOTE — Assessment & Plan Note (Addendum)
 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  I explained the positive margin-although this may be ink approaching a pseudocapsule instead of true violation or positive parenchymal margin.  Considering Megan Knight age, we will maintain a vigilant surveillance protocol as Megan Knight risk of local recurrence may have be slightly higher.  - Surveillance CT renal protocol in 6 months - Followed by surveillance CT in 12 months, HPI, BMP and CXR-if stable will likely transition to annual surveillance

## 2024-08-09 NOTE — Progress Notes (Unsigned)
 08/13/2024 11:15 AM   Megan Knight [birth year] [id number]  Reason for visit: Follow up partial nephrectomy   HPI: 48 y.o. 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, (reported focal +margin, although grossly negative w/ enucleation)  Visit completed via video Vietnamese interpreter Overall she is doing well, recovered from surgery appropriately Denies any abdominal pain Minor complaints of incisional pruritus  Prior HPI: ED visit (06/16/24) - abdominal pain, SOB    - CT A/P w/ con (06/16/24) - 1.7 cm enhancing RLP renal mass No prior urologic history Denies family history of GU malignancies No personal history of cancer She has subacute on chronic right flank pain Non-smoker   BMI 27 Prior chole in 2019, C-section, abdominal liposuction       Physical Exam: BP 129/84   Pulse 80   Ht 5' (1.524 m)   Wt 160 lb (72.6 kg)   BMI 31.25 kg/m    Constitutional:  Alert and oriented, No acute distress. GU: Abdominal port incisions healing, some residual Dermabond intact.  Otherwise clean and intact.  No hernias.  No palpable tenderness.  Laboratory Data: REPORT OF SURGICAL PATHOLOGY   Accession #: [id number] Patient Name: Megan Knight, [place] Visit # : [id number]  MRN: [id number] Physician: Georganne Riis DOB/Age 04/12/1976 (Age: 80) Gender: F Collected Date: 07/10/2024 Received Date: 07/11/2024  FINAL DIAGNOSIS       1. 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      TUMOR SIZE: 1.4 X 1.2 X 1.1 CM.      TUMOR IS CONFINED TO KIDNEY.      NO LYMPHOVASCULAR INVASION IDENTIFIED.      PARENCHYMAL MARGIN IS POSITIVE FOR CARCINOMA.      SEE ONCOLOGY TABLE.       Diagnosis Note : The specimen shows an unencapsulated renal neoplasm with      predominant solid growth with focal cystic architecture. The  neoplastic cells      are polygonal and have voluminous eosinophilic cytoplasm. The cysts are      typically lined by a single layer of hobnail cells. Some cells contain      cytoplasmic vacuolization. The nuclei are round to oval with variably prominent      nucleoli. Immunohistochemical stains were performed to characterize the tumor      cells. The cells are positive for PAX8 and CK8/18. CD20 shows facol and strong      positive staining. The cells are patchy positive for CD10, and weakly positive      for AMACR and Melan A. The cells are negative for CK AE1/AE3, CK903, CK7, CD117,      CA-IX, GATA3, SMM-1 and HMB45. The overall features are in keeping with an      eosinophilic solid and cystic renal cell carcinoma.      Controls worked appropriately.      ONCOLOGY TABLE      KIDNEY: Nephrectomy      Procedure: Wedge excision / partial resection      Specimen Laterality: Right      Tumor Size: 1.4 x 1.2 x 1.1 cm      Tumor Focality: Unifocal      Histologic Type: Eosinophilic solid cystic renal cell carcinoma      Sarcomatoid Features: Not identified      Rhabdoid  Features: Not identified      Histologic Grade: Grade 2      Tumor Necrosis: Not identified      Tumor Extension: Tumor is confined to kidney      Lymphatic and/or Vascular Invasion:  Not identified      Margins: Parenchymal margin is positive for carcinoma      Regional Lymph Nodes:      Number of Lymph Nodes with Tumor: NA      Number of Lymph Nodes Examined: NA      Distant Metastasis:      Distant Site(s) Involved: Not applicable      Additional Findings in Nonneoplastic Kidney: NA      Pathologic Stage Classification (pTNM, AJCC 8th Edition): pT1a, pNx   Pertinent Imaging: N/A    Assessment & Plan:    Renal cell carcinoma of right kidney [facility]) Assessment & Plan: S/p Right robotic partial nephrectomy on 06/30/24  - 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  I explained the positive margin-although this may be ink approaching a pseudocapsule instead of true violation or positive parenchymal margin.  Considering her age, we will maintain a vigilant surveillance protocol as her risk of local recurrence may have be slightly higher.  - Surveillance CT renal protocol in 6 months - Followed by surveillance CT in 12 months, HPI, BMP and CXR-if stable will likely transition to annual surveillance  Orders: -     CT ABDOMEN PELVIS W WO CONTRAST; Future       Megan JONELLE Skye, MD  [facility] [address], [address] [place], [place] [address] [phone]

## 2024-08-13 ENCOUNTER — Ambulatory Visit: Admitting: Urology

## 2024-08-13 VITALS — BP 129/84 | HR 80 | Ht 60.0 in | Wt 160.0 lb

## 2024-08-13 DIAGNOSIS — C641 Malignant neoplasm of right kidney, except renal pelvis: Secondary | ICD-10-CM

## 2024-08-13 NOTE — Patient Instructions (Signed)
 Please call [phone] to schedule your imaging prior to your appointment. Please allow time for your imaging results as this can take up 2-3 weeks to review. A follow up appointment as already been scheduled for the doctor to review results with you.

## 2024-08-19 NOTE — Progress Notes (Unsigned)
"  Subjective:    Patient ID: Megan Knight, female    DOB: Jun 25, 1976, 48 y.o.   MRN: [id number]  HPI  Patient presents to the clinic today for 6 months followup chronic conditions.  Renal cell carcinoma: s/p partial nephrectomy 06/2024. She has not undergone chemo or radiation. They plan to repeat the CT scan in 6 months. She follows with urology.  HLD: Her last LDL was 78, triglycerides 877, 01/2024. She is not taking atorvastatin  as prescribed, ran out 2 weeks ago. She does not consume a low fat diet.  GERD: Triggered by spicy foods. She is no longer taking pantoprazole  but is taking sucralfate . There is no upper GI on file.  Prediabetes: Her last A1C was 6.2%, 01/2024. She is not taking any medications for this. She does not check her sugars.  Review of Systems     Past Medical History:  Diagnosis Date   Bladder stone    GERD (gastroesophageal reflux disease)    Hyperlipidemia    Kidney lesion    Pre-diabetes     Current Outpatient Medications  Medication Sig Dispense Refill   atorvastatin  (LIPITOR) 10 MG tablet TAKE 1 TABLET(10 MG) BY MOUTH DAILY 90 tablet 0   cetirizine (ZYRTEC) 10 MG tablet Take 10 mg by mouth daily as needed for allergies.     diphenhydrAMINE (BENADRYL) 25 MG tablet Take 25 mg by mouth every 6 (six) hours as needed for itching.     docusate sodium  (COLACE) 100 MG capsule Take 1 capsule (100 mg total) by mouth 2 (two) times daily. 10 capsule 0   pantoprazole  (PROTONIX ) 40 MG tablet Take 1 tablet (40 mg total) by mouth daily. (Patient taking differently: Take 40 mg by mouth every morning.) 30 tablet 1   sucralfate  (CARAFATE ) 1 g tablet Take 1 tablet (1 g total) by mouth 4 (four) times daily for 15 days. 60 tablet 0   No current facility-administered medications for this visit.    Allergies  Allergen Reactions   Shellfish Allergy Hives    Family History  Problem Relation Age of Onset   Healthy Mother    Healthy Brother    Cancer - Colon  Neg Hx    Breast cancer Neg Hx     Social History   Socioeconomic History   Marital status: Divorced    Spouse name: Not on file   Number of children: 3   Years of education: Not on file   Highest education level: Not on file  Occupational History   Not on file  Tobacco Use   Smoking status: Never   Smokeless tobacco: Never  Vaping Use   Vaping status: Never Used  Substance and Sexual Activity   Alcohol use: Not Currently    Comment: rarely < 2 per occasion, less than monthly consumption of alcohol   Drug use: Never   Sexual activity: Not Currently    Birth control/protection: Implant    Comment: mutually monogamous relationship  Other Topics Concern   Not on file  Social History Narrative   Not on file   Social Drivers of Health   Tobacco Use: Low Risk (07/10/2024)   Patient History    Smoking Tobacco Use: Never    Smokeless Tobacco Use: Never    Passive Exposure: Not on file  Financial Resource Strain: Not on file  Food Insecurity: No Food Insecurity (07/10/2024)   Epic    Worried About Radiation Protection Practitioner of Food in the Last Year: Never true  Ran Out of Food in the Last Year: Never true  Transportation Needs: No Transportation Needs (07/10/2024)   Epic    Lack of Transportation (Medical): No    Lack of Transportation (Non-Medical): No  Physical Activity: Not on file  Stress: Not on file  Social Connections: Not on file  Intimate Partner Violence: Unknown (07/10/2024)   Epic    Fear of Current or Ex-Partner: No    Emotionally Abused: No    Physically Abused: Patient declined    Sexually Abused: No  Depression (PHQ2-9): Low Risk (06/25/2024)   Depression (PHQ2-9)    PHQ-2 Score: 0  Alcohol Screen: Low Risk (01/21/2022)   Alcohol Screen    Last Alcohol Screening Score (AUDIT): 1  Housing: Low Risk (07/10/2024)   Epic    Unable to Pay for Housing in the Last Year: No    Number of Times Moved in the Last Year: 0    Homeless in the  Last Year: No  Utilities: Not At Risk (07/10/2024)   Epic    Threatened with loss of utilities: No  Health Literacy: Not on file     Constitutional: Denies fever, malaise, fatigue, headache or abrupt weight changes.  HEENT: Denies eye pain, eye redness, ear pain, ringing in the ears, wax buildup, runny nose, nasal congestion, bloody nose, or sore throat. Respiratory: Denies difficulty breathing, shortness of breath, cough or sputum production.   Cardiovascular: Denies chest pain, chest tightness, palpitations or swelling in the hands or feet.  Gastrointestinal: Denies abdominal pain, bloating, constipation, diarrhea or blood in the stool.  GU: Pt reports amenorrhea. Denies urgency, frequency, pain with urination, burning sensation, blood in urine, odor or discharge. Musculoskeletal: Denies decrease in range of motion, difficulty with gait, muscle pain or joint pain and swelling.  Skin: Denies redness, rashes, lesions or ulcercations.  Neurological: Pt reports hot flashes. Denies dizziness, difficulty with memory, difficulty with speech or problems with balance and coordination.  Psych: Denies anxiety, depression, SI/HI.  No other specific complaints in a complete review of systems (except as listed in HPI above).  Objective:   Physical Exam There were no vitals taken for this visit.   Wt Readings from Last 3 Encounters:  08/13/24 160 lb (72.6 kg)  07/10/24 160 lb (72.6 kg)  06/25/24 159 lb (72.1 kg)    General: Appears her stated age, overweight, in NAD. Skin: Warm, dry and intact.  HEENT: Head: normal shape and size; Eyes: sclera white, no icterus, conjunctiva pink, PERRLA and EOMs intact;  Neck:  Neck supple, trachea midline. No masses, lumps or thyromegaly present.  Cardiovascular: Normal rate and rhythm. S1,S2 noted.  No murmur, rubs or gallops noted. No JVD or BLE edema. Pulmonary/Chest: Normal effort and positive vesicular breath sounds. No respiratory distress. No  wheezes, rales or ronchi noted.  Abdomen: Normal bowel sounds.  Musculoskeletal: Bunions noted of bilateral feet. Strength 5/5 BUE/BLE. No difficulty with gait.  Neurological: Alert and oriented. Cranial nerves II-XII grossly intact. Coordination normal.  Psychiatric: Mood and affect normal. Behavior is normal. Judgment and thought content normal.     BMET    Component Value Date/Time   NA 138 07/11/2024 0556   K 3.7 07/11/2024 0556   CL 104 07/11/2024 0556   CO2 24 07/11/2024 0556   GLUCOSE 138 (H) 07/11/2024 0556   BUN 6 07/11/2024 0556   CREATININE 0.49 07/11/2024 0556   CREATININE 0.67 02/21/2024 1008   CALCIUM  9.1 07/11/2024 0556   GFRNONAA >60 07/11/2024 9443  GFRNONAA 94 05/02/2018 1034   GFRAA 109 05/02/2018 1034    Lipid Panel     Component Value Date/Time   CHOL 148 02/21/2024 1008   TRIG 122 02/21/2024 1008   HDL 50 02/21/2024 1008   CHOLHDL 3.0 02/21/2024 1008   LDLCALC 78 02/21/2024 1008    CBC    Component Value Date/Time   WBC 14.5 (H) 07/11/2024 0556   RBC 4.60 07/11/2024 0556   HGB 14.2 07/11/2024 0556   HCT 41.7 07/11/2024 0556   PLT 294 07/11/2024 0556   MCV 90.7 07/11/2024 0556   MCH 30.9 07/11/2024 0556   MCHC 34.1 07/11/2024 0556   RDW 12.2 07/11/2024 0556   LYMPHSABS 3,032 05/02/2018 1034   MONOABS 0.7 11/30/2017 0821   EOSABS 1,142 (H) 05/02/2018 1034   BASOSABS 42 05/02/2018 1034    Hgb A1C Lab Results  Component Value Date   HGBA1C 6.2 (H) 02/21/2024            Assessment & Plan:     RTC in 6 months for your annual exam Angeline Laura, NP  "

## 2024-08-20 ENCOUNTER — Encounter: Payer: Self-pay | Admitting: Internal Medicine

## 2024-08-20 ENCOUNTER — Ambulatory Visit: Admitting: Internal Medicine

## 2024-08-20 VITALS — BP 110/74 | HR 77 | Ht 60.0 in | Wt 155.6 lb

## 2024-08-20 DIAGNOSIS — R7303 Prediabetes: Secondary | ICD-10-CM

## 2024-08-20 DIAGNOSIS — Z6830 Body mass index (BMI) 30.0-30.9, adult: Secondary | ICD-10-CM

## 2024-08-20 DIAGNOSIS — E66811 Obesity, class 1: Secondary | ICD-10-CM | POA: Diagnosis not present

## 2024-08-20 DIAGNOSIS — E6609 Other obesity due to excess calories: Secondary | ICD-10-CM

## 2024-08-20 DIAGNOSIS — Z23 Encounter for immunization: Secondary | ICD-10-CM

## 2024-08-20 DIAGNOSIS — Z85528 Personal history of other malignant neoplasm of kidney: Secondary | ICD-10-CM | POA: Diagnosis not present

## 2024-08-20 DIAGNOSIS — E7800 Pure hypercholesterolemia, unspecified: Secondary | ICD-10-CM | POA: Diagnosis not present

## 2024-08-20 DIAGNOSIS — K219 Gastro-esophageal reflux disease without esophagitis: Secondary | ICD-10-CM | POA: Diagnosis not present

## 2024-08-20 LAB — COMPREHENSIVE METABOLIC PANEL WITH GFR
AG Ratio: 1.3 (calc) (ref 1.0–2.5)
ALT: 13 U/L (ref 6–29)
AST: 13 U/L (ref 10–35)
Albumin: 4.2 g/dL (ref 3.6–5.1)
Alkaline phosphatase (APISO): 48 U/L (ref 31–125)
BUN: 12 mg/dL (ref 7–25)
CO2: 27 mmol/L (ref 20–32)
Calcium: 9.1 mg/dL (ref 8.6–10.2)
Chloride: 102 mmol/L (ref 98–110)
Creat: 0.65 mg/dL (ref 0.50–0.99)
Globulin: 3.2 g/dL (ref 1.9–3.7)
Glucose, Bld: 99 mg/dL (ref 65–99)
Potassium: 4.1 mmol/L (ref 3.5–5.3)
Sodium: 138 mmol/L (ref 135–146)
Total Bilirubin: 0.4 mg/dL (ref 0.2–1.2)
Total Protein: 7.4 g/dL (ref 6.1–8.1)
eGFR: 109 mL/min/1.73m2

## 2024-08-20 LAB — LIPID PANEL
Cholesterol: 218 mg/dL — ABNORMAL HIGH
HDL: 44 mg/dL — ABNORMAL LOW
LDL Cholesterol (Calc): 148 mg/dL — ABNORMAL HIGH
Non-HDL Cholesterol (Calc): 174 mg/dL — ABNORMAL HIGH
Total CHOL/HDL Ratio: 5 (calc) — ABNORMAL HIGH
Triglycerides: 134 mg/dL

## 2024-08-20 LAB — HEMOGLOBIN A1C
Hgb A1c MFr Bld: 5.7 % — ABNORMAL HIGH
Mean Plasma Glucose: 117 mg/dL
eAG (mmol/L): 6.5 mmol/L

## 2024-08-20 MED ORDER — ATORVASTATIN CALCIUM 10 MG PO TABS: 10.0000 mg | ORAL_TABLET | Freq: Every day | ORAL | 1 refills | Status: AC

## 2024-08-20 MED ORDER — PANTOPRAZOLE SODIUM 40 MG PO TBEC: 40.0000 mg | DELAYED_RELEASE_TABLET | Freq: Every day | ORAL | 1 refills | Status: AC

## 2024-08-20 NOTE — Assessment & Plan Note (Signed)
 Complicated by obesity C-Met and lipid profile today Encouraged low fat diet and exercise for weight loss Will have her start back on atorvastatin  10 mg daily

## 2024-08-20 NOTE — Assessment & Plan Note (Signed)
 Complicated by obesity Avoid foods that trigger your reflux Encourage weight loss as this can help reduce reflux symptoms Continue pantoprazole  40 mg daily

## 2024-08-20 NOTE — Assessment & Plan Note (Signed)
 Complicated by obesity A1c today Encourage low-carb diet and exercise for weight loss

## 2024-08-20 NOTE — Patient Instructions (Signed)

## 2024-08-20 NOTE — Assessment & Plan Note (Signed)
 Status post excision, no chemo or radiation Plan to repeat CT in 6 months She will continue to follow-up with nephrology and oncology

## 2024-08-20 NOTE — Assessment & Plan Note (Signed)
 Encourage diet and exercise for weight loss

## 2024-08-21 ENCOUNTER — Ambulatory Visit: Payer: Self-pay | Admitting: Internal Medicine

## 2024-09-17 ENCOUNTER — Inpatient Hospital Stay: Admitting: Oncology

## 2024-10-31 ENCOUNTER — Ambulatory Visit: Admitting: Plastic Surgery

## 2025-02-11 ENCOUNTER — Ambulatory Visit: Admitting: Urology

## 2025-02-25 ENCOUNTER — Encounter: Admitting: Internal Medicine
